# Patient Record
Sex: Female | Born: 1940 | Race: White | Hispanic: No | Marital: Married | Smoking: Former smoker
Health system: Southern US, Community
[De-identification: ages and names within clinical notes are randomized; demographics above are authoritative.]

## PROBLEM LIST (undated history)

## (undated) DIAGNOSIS — F419 Anxiety disorder, unspecified: Secondary | ICD-10-CM

## (undated) DIAGNOSIS — M1990 Unspecified osteoarthritis, unspecified site: Secondary | ICD-10-CM

## (undated) DIAGNOSIS — K219 Gastro-esophageal reflux disease without esophagitis: Secondary | ICD-10-CM

## (undated) DIAGNOSIS — I499 Cardiac arrhythmia, unspecified: Secondary | ICD-10-CM

## (undated) HISTORY — PX: BACK SURGERY: SHX140

## (undated) HISTORY — PX: CARDIAC CATHETERIZATION: SHX172

## (undated) HISTORY — PX: TONSILLECTOMY: SUR1361

## (undated) HISTORY — PX: WRIST FRACTURE SURGERY: SHX121

## (undated) HISTORY — PX: WRIST HARDWARE REMOVAL: SHX2673

## (undated) HISTORY — PX: VAGINAL HYSTERECTOMY: SUR661

## (undated) HISTORY — PX: CERVICAL DISC SURGERY: SHX588

## (undated) HISTORY — PX: EYE SURGERY: SHX253

## (undated) HISTORY — PX: COLONOSCOPY: SHX174

## (undated) HISTORY — PX: FRACTURE SURGERY: SHX138

---

## 1971-12-13 HISTORY — PX: TUBAL LIGATION: SHX77

## 1971-12-13 HISTORY — PX: APPENDECTOMY: SHX54

## 1998-11-26 ENCOUNTER — Other Ambulatory Visit: Admission: RE | Admit: 1998-11-26 | Discharge: 1998-11-26 | Payer: Self-pay | Admitting: Obstetrics and Gynecology

## 2000-10-25 ENCOUNTER — Other Ambulatory Visit: Admission: RE | Admit: 2000-10-25 | Discharge: 2000-10-25 | Payer: Self-pay | Admitting: Internal Medicine

## 2003-07-23 ENCOUNTER — Ambulatory Visit (HOSPITAL_COMMUNITY): Admission: RE | Admit: 2003-07-23 | Discharge: 2003-07-23 | Payer: Self-pay | Admitting: Gastroenterology

## 2003-07-23 ENCOUNTER — Encounter (INDEPENDENT_AMBULATORY_CARE_PROVIDER_SITE_OTHER): Payer: Self-pay | Admitting: *Deleted

## 2003-07-23 ENCOUNTER — Encounter: Payer: Self-pay | Admitting: Gastroenterology

## 2007-10-05 ENCOUNTER — Ambulatory Visit: Payer: Self-pay | Admitting: Unknown Physician Specialty

## 2007-12-27 ENCOUNTER — Ambulatory Visit: Payer: Self-pay | Admitting: Unknown Physician Specialty

## 2009-11-03 ENCOUNTER — Encounter (INDEPENDENT_AMBULATORY_CARE_PROVIDER_SITE_OTHER): Payer: Self-pay

## 2009-11-04 ENCOUNTER — Ambulatory Visit: Payer: Self-pay | Admitting: Internal Medicine

## 2009-11-19 ENCOUNTER — Ambulatory Visit: Payer: Self-pay | Admitting: Internal Medicine

## 2009-11-23 ENCOUNTER — Encounter: Payer: Self-pay | Admitting: Internal Medicine

## 2011-04-29 NOTE — Op Note (Signed)
Erika Black, Erika Black                         ACCOUNT NO.:  [id number]   MEDICAL RECORD NO.:  [id number]                   PATIENT TYPE:  AMB   LOCATION:  ENDO                                 FACILITY:  [facility]   PHYSICIAN:  Petra Kuba, M.D.                 DATE OF BIRTH:  25-Jan-1941   DATE OF PROCEDURE:  07/23/2003  DATE OF DISCHARGE:                                 OPERATIVE REPORT   PROCEDURE:  Colonoscopy.   INDICATIONS FOR PROCEDURE:  Screening.   Consent was signed after risks, benefits, and options were thoroughly  discussed in the office.   MEDICATIONS USED:  Demerol 75, Versed 10.   PROCEDURE:  Rectal inspection was pertinent for external hemorrhoids.  Digital exam was negative.  The first video pediatric adjustable colonoscope  was inserted and unfortunately, we had some looping and tortuosity at the  splenic flexure, we could advance to the proximal hepatic flexure but  despite multiple abdominal pressure, rolling her on her back, and on her  right side, we could not advance any further and elected to withdrawal.  No  abnormalities were seen on the withdrawal.  We went ahead and retroflexed  which revealed some internal hemorrhoids.  We then removed this scope and  inserted the regular colonoscope and with some abdominal pressure, advanced  to the proximal level of the hepatic flexure, however, rolling her on her  back was not helpful and unfortunately, we fell back to the splenic flexure  probably across the hepatic flexure and could not advance any further due to  looping, so we elected to stop the procedure at that juncture.  No  abnormality was seen on slow withdrawal.  There was some trauma to some  hemorrhoids.  The scope was removed.  The patient tolerated the procedure  fair.  There was no obvious complications.   ENDOSCOPIC DIAGNOSIS:  1. Internal and external hemorrhoids with some trauma from the scope.  2. Long looping colon.  3. Unable to advance  either the pediatric adjustable or the regular scope     past the hepatic flexure.  4. No obvious abnormalities seen.    PLAN:  Get an air contrast barium enema today and if OK, recheck colon  screening in five years, otherwise return to the care of Dr. Fredia Beets for  the customary health care maintenance to include yearly rectals and guaiacs  and also would refer back to Dr. Patty Sermons for some bigeminy and abnormal  heart rates which he has had in the past, although they were asymptomatic  during our test.                                               Petra Kuba, M.D.    MEM/MEDQ  D:  07/23/2003  T:  07/23/2003  Job:  [id number]   cc:   Cassell Clement, M.D.  [address]  [place]  [place] [address]  Fax: [phone]   Geoffry Paradise, M.D.

## 2014-04-30 ENCOUNTER — Encounter (INDEPENDENT_AMBULATORY_CARE_PROVIDER_SITE_OTHER): Payer: Medicare Other | Admitting: Ophthalmology

## 2014-04-30 DIAGNOSIS — H33001 Unspecified retinal detachment with retinal break, right eye: Secondary | ICD-10-CM | POA: Diagnosis present

## 2014-04-30 DIAGNOSIS — H3530 Unspecified macular degeneration: Secondary | ICD-10-CM

## 2014-04-30 DIAGNOSIS — H2510 Age-related nuclear cataract, unspecified eye: Secondary | ICD-10-CM

## 2014-04-30 DIAGNOSIS — H33009 Unspecified retinal detachment with retinal break, unspecified eye: Secondary | ICD-10-CM

## 2014-04-30 DIAGNOSIS — H43819 Vitreous degeneration, unspecified eye: Secondary | ICD-10-CM

## 2014-04-30 NOTE — H&P (Signed)
Erika Black is an [age] y.o. female.   Chief Complaint:Severe loss of vision right eye one week ago HPI: Noted loss of vision from the nose to the central vision right eye one week ago  No past medical history on file.  No past surgical history on file.  No family history on file. Social History:  has no tobacco, alcohol, and drug history on file.  Allergies: Allergies not on file  No prescriptions prior to admission    Review of systems otherwise negative  There were no vitals taken for this visit.  Physical exam: Mental status: oriented x3. Eyes: See eye exam associated with this date of surgery in media tab.  Scanned in by scanning center Ears, Nose, Throat: within normal limits Neck: Within Normal limits General: within normal limits Chest: Within normal limits Breast: deferred Heart: Within normal limits Abdomen: Within normal limits GU: deferred Extremities: within normal limits Skin: within normal limits  Assessment/Plan Rhegmatogenous Retinal Detachment  Right eye Plan: To [facility] Scleral buckle, laser treatment, gas injection, possible pars plana vitrectomy right eye  Sherrie GeorgeJohn D [name] 04/30/2014, 5:28 PM

## 2014-05-01 ENCOUNTER — Encounter (HOSPITAL_COMMUNITY): Payer: Self-pay | Admitting: Pharmacy Technician

## 2014-05-02 NOTE — Progress Notes (Signed)
I was unable to reach patient by phone.  I left  A message on voice mail.  I instructed the patient to arrive at [facility] Main entrance at the time she was instructed by Dr Ashley Royalty.  Nothing to eat or drink after midnight.   I instructed the patient to take the following medications in the am with just enough water to get them down that Dr Ashley Royalty instructed her.to take  I asked patient to not wear any lotions, powders, cologne, jewelry, piercing, make-up or nail polish.  I asked the patient to call [phone], in the am if there were any questions or problems.

## 2014-05-05 MED ORDER — TROPICAMIDE 1 % OP SOLN
1.0000 [drp] | OPHTHALMIC | Status: DC | PRN
  Administered 2014-05-06: 1 [drp] via OPHTHALMIC

## 2014-05-05 MED ORDER — GATIFLOXACIN 0.5 % OP SOLN: 1.0000 [drp] | OPHTHALMIC | Status: DC | PRN

## 2014-05-05 MED ORDER — PHENYLEPHRINE HCL 2.5 % OP SOLN
1.0000 [drp] | OPHTHALMIC | Status: DC | PRN
  Administered 2014-05-06: 1 [drp] via OPHTHALMIC

## 2014-05-05 MED ORDER — CYCLOPENTOLATE HCL 1 % OP SOLN: 1.0000 [drp] | OPHTHALMIC | Status: DC | PRN

## 2014-05-06 ENCOUNTER — Ambulatory Visit (HOSPITAL_COMMUNITY): Payer: Medicare Other | Admitting: Anesthesiology

## 2014-05-06 ENCOUNTER — Ambulatory Visit (HOSPITAL_COMMUNITY): Payer: Medicare Other

## 2014-05-06 ENCOUNTER — Encounter (HOSPITAL_COMMUNITY): Payer: Self-pay | Admitting: *Deleted

## 2014-05-06 ENCOUNTER — Encounter (HOSPITAL_COMMUNITY)
Admission: RE | Disposition: A | Discharge status: Home / Self Care | Payer: Self-pay | Source: Ambulatory Visit | Attending: Ophthalmology

## 2014-05-06 ENCOUNTER — Encounter (HOSPITAL_COMMUNITY): Payer: Medicare Other | Admitting: Anesthesiology

## 2014-05-06 ENCOUNTER — Ambulatory Visit (HOSPITAL_COMMUNITY)
Admission: RE | Admit: 2014-05-06 | Discharge: 2014-05-07 | Disposition: A | Discharge status: Home / Self Care | Payer: Medicare Other | Source: Ambulatory Visit | Attending: Ophthalmology | Admitting: Ophthalmology

## 2014-05-06 DIAGNOSIS — K219 Gastro-esophageal reflux disease without esophagitis: Secondary | ICD-10-CM | POA: Insufficient documentation

## 2014-05-06 DIAGNOSIS — H33001 Unspecified retinal detachment with retinal break, right eye: Secondary | ICD-10-CM

## 2014-05-06 DIAGNOSIS — H33009 Unspecified retinal detachment with retinal break, unspecified eye: Secondary | ICD-10-CM | POA: Insufficient documentation

## 2014-05-06 DIAGNOSIS — Z87891 Personal history of nicotine dependence: Secondary | ICD-10-CM | POA: Insufficient documentation

## 2014-05-06 DIAGNOSIS — I499 Cardiac arrhythmia, unspecified: Secondary | ICD-10-CM | POA: Insufficient documentation

## 2014-05-06 HISTORY — DX: Cardiac arrhythmia, unspecified: I49.9

## 2014-05-06 HISTORY — DX: Anxiety disorder, unspecified: F41.9

## 2014-05-06 HISTORY — DX: Unspecified osteoarthritis, unspecified site: M19.90

## 2014-05-06 HISTORY — PX: PHOTOCOAGULATION WITH LASER: SHX6027

## 2014-05-06 HISTORY — PX: GAS INSERTION: SHX5336

## 2014-05-06 HISTORY — PX: SCLERAL BUCKLE: SHX5340

## 2014-05-06 HISTORY — DX: Gastro-esophageal reflux disease without esophagitis: K21.9

## 2014-05-06 SURGERY — SCLERAL BUCKLE
Anesthesia: General | Site: Eye | Laterality: Right

## 2014-05-06 MED ORDER — BACITRACIN-POLYMYXIN B 500-10000 UNIT/GM OP OINT
TOPICAL_OINTMENT | OPHTHALMIC | Status: DC | PRN
  Administered 2014-05-06: 1 via OPHTHALMIC

## 2014-05-06 MED ORDER — LIDOCAINE HCL (CARDIAC) 20 MG/ML IV SOLN
INTRAVENOUS | Status: AC
  Filled 2014-05-06: qty 10

## 2014-05-06 MED ORDER — ACETAZOLAMIDE SODIUM 500 MG IJ SOLR
500.0000 mg | Freq: Once | INTRAMUSCULAR | Status: AC
  Administered 2014-05-07: 500 mg via INTRAVENOUS
  Filled 2014-05-06: qty 500

## 2014-05-06 MED ORDER — SODIUM CHLORIDE 0.45 % IV SOLN
INTRAVENOUS | Status: DC
  Administered 2014-05-06: 10 mL/h via INTRAVENOUS

## 2014-05-06 MED ORDER — DEXAMETHASONE SODIUM PHOSPHATE 10 MG/ML IJ SOLN
INTRAMUSCULAR | Status: AC
  Filled 2014-05-06: qty 1

## 2014-05-06 MED ORDER — MAGNESIUM HYDROXIDE 400 MG/5ML PO SUSP: 15.0000 mL | Freq: Four times a day (QID) | ORAL | Status: DC | PRN

## 2014-05-06 MED ORDER — CYCLOPENTOLATE HCL 1 % OP SOLN
1.0000 [drp] | OPHTHALMIC | Status: AC | PRN
  Administered 2014-05-06 (×3): 1 [drp] via OPHTHALMIC

## 2014-05-06 MED ORDER — GATIFLOXACIN 0.5 % OP SOLN
OPHTHALMIC | Status: AC
  Filled 2014-05-06: qty 2.5

## 2014-05-06 MED ORDER — POLYMYXIN B SULFATE 500000 UNITS IJ SOLR
INTRAMUSCULAR | Status: DC | PRN
  Administered 2014-05-06: 12:00:00

## 2014-05-06 MED ORDER — BACITRACIN-POLYMYXIN B 500-10000 UNIT/GM OP OINT
1.0000 "application " | TOPICAL_OINTMENT | Freq: Four times a day (QID) | OPHTHALMIC | Status: DC
  Filled 2014-05-06: qty 3.5

## 2014-05-06 MED ORDER — SODIUM CHLORIDE 0.9 % IJ SOLN
INTRAMUSCULAR | Status: AC
  Filled 2014-05-06: qty 10

## 2014-05-06 MED ORDER — BSS IO SOLN
INTRAOCULAR | Status: AC
  Filled 2014-05-06: qty 15

## 2014-05-06 MED ORDER — TROPICAMIDE 1 % OP SOLN
OPHTHALMIC | Status: AC
  Administered 2014-05-06: 1 [drp] via OPHTHALMIC
  Filled 2014-05-06: qty 3

## 2014-05-06 MED ORDER — CEFAZOLIN SODIUM-DEXTROSE 2-3 GM-% IV SOLR
2.0000 g | INTRAVENOUS | Status: AC
  Administered 2014-05-06: 2 g via INTRAVENOUS

## 2014-05-06 MED ORDER — HYDROMORPHONE HCL PF 1 MG/ML IJ SOLN
INTRAMUSCULAR | Status: AC
  Administered 2014-05-06: 0.5 mg via INTRAVENOUS
  Filled 2014-05-06: qty 1

## 2014-05-06 MED ORDER — GABAPENTIN 300 MG PO CAPS
300.0000 mg | ORAL_CAPSULE | Freq: Two times a day (BID) | ORAL | Status: DC
  Administered 2014-05-06: 300 mg via ORAL
  Filled 2014-05-06 (×3): qty 1

## 2014-05-06 MED ORDER — ATROPINE SULFATE 1 % OP SOLN
OPHTHALMIC | Status: AC
  Filled 2014-05-06: qty 2

## 2014-05-06 MED ORDER — BUPIVACAINE HCL (PF) 0.75 % IJ SOLN
INTRAMUSCULAR | Status: DC | PRN
  Administered 2014-05-06: 10 mL

## 2014-05-06 MED ORDER — LATANOPROST 0.005 % OP SOLN
1.0000 [drp] | Freq: Every day | OPHTHALMIC | Status: DC
  Filled 2014-05-06: qty 2.5

## 2014-05-06 MED ORDER — TROPICAMIDE 1 % OP SOLN
1.0000 [drp] | OPHTHALMIC | Status: DC | PRN
  Administered 2014-05-06 (×2): 1 [drp] via OPHTHALMIC

## 2014-05-06 MED ORDER — HYPROMELLOSE (GONIOSCOPIC) 2.5 % OP SOLN
OPHTHALMIC | Status: AC
  Filled 2014-05-06: qty 15

## 2014-05-06 MED ORDER — BUPIVACAINE HCL (PF) 0.75 % IJ SOLN
INTRAMUSCULAR | Status: AC
  Filled 2014-05-06: qty 10

## 2014-05-06 MED ORDER — GATIFLOXACIN 0.5 % OP SOLN
1.0000 [drp] | Freq: Four times a day (QID) | OPHTHALMIC | Status: DC
  Filled 2014-05-06: qty 2.5

## 2014-05-06 MED ORDER — PREDNISOLONE ACETATE 1 % OP SUSP
1.0000 [drp] | Freq: Four times a day (QID) | OPHTHALMIC | Status: DC
  Filled 2014-05-06: qty 5
  Filled 2014-05-06: qty 1

## 2014-05-06 MED ORDER — DOCUSATE SODIUM 100 MG PO CAPS
100.0000 mg | ORAL_CAPSULE | Freq: Two times a day (BID) | ORAL | Status: DC
  Administered 2014-05-06: 100 mg via ORAL
  Filled 2014-05-06: qty 1

## 2014-05-06 MED ORDER — GLYCOPYRROLATE 0.2 MG/ML IJ SOLN
INTRAMUSCULAR | Status: AC
  Filled 2014-05-06: qty 3

## 2014-05-06 MED ORDER — ONDANSETRON HCL 4 MG/2ML IJ SOLN
INTRAMUSCULAR | Status: AC
  Filled 2014-05-06: qty 2

## 2014-05-06 MED ORDER — PHENYLEPHRINE HCL 2.5 % OP SOLN
1.0000 [drp] | OPHTHALMIC | Status: DC | PRN
  Administered 2014-05-06 (×2): 1 [drp] via OPHTHALMIC

## 2014-05-06 MED ORDER — TETRACAINE HCL 0.5 % OP SOLN
2.0000 [drp] | Freq: Once | OPHTHALMIC | Status: DC
  Filled 2014-05-06: qty 2

## 2014-05-06 MED ORDER — ONDANSETRON HCL 4 MG/2ML IJ SOLN: 4.0000 mg | Freq: Once | INTRAMUSCULAR | Status: DC | PRN

## 2014-05-06 MED ORDER — TEMAZEPAM 15 MG PO CAPS: 15.0000 mg | ORAL_CAPSULE | Freq: Every evening | ORAL | Status: DC | PRN

## 2014-05-06 MED ORDER — SODIUM CHLORIDE 0.9 % IV SOLN
INTRAVENOUS | Status: DC
  Administered 2014-05-06 (×2): via INTRAVENOUS

## 2014-05-06 MED ORDER — ACETAZOLAMIDE SODIUM 500 MG IJ SOLR
INTRAMUSCULAR | Status: AC
  Filled 2014-05-06: qty 500

## 2014-05-06 MED ORDER — METOPROLOL SUCCINATE ER 25 MG PO TB24
25.0000 mg | ORAL_TABLET | Freq: Every day | ORAL | Status: DC
  Filled 2014-05-06: qty 1

## 2014-05-06 MED ORDER — GLYCOPYRROLATE 0.2 MG/ML IJ SOLN
INTRAMUSCULAR | Status: DC | PRN
  Administered 2014-05-06: 0.4 mg via INTRAVENOUS

## 2014-05-06 MED ORDER — FENTANYL CITRATE 0.05 MG/ML IJ SOLN
INTRAMUSCULAR | Status: DC | PRN
  Administered 2014-05-06: 100 ug via INTRAVENOUS
  Administered 2014-05-06 (×3): 50 ug via INTRAVENOUS

## 2014-05-06 MED ORDER — EPHEDRINE SULFATE 50 MG/ML IJ SOLN
INTRAMUSCULAR | Status: DC | PRN
Start: 2014-05-06 — End: 2014-05-06
  Administered 2014-05-06 (×2): 10 mg via INTRAVENOUS

## 2014-05-06 MED ORDER — LACTATED RINGERS IV SOLN: INTRAVENOUS | Status: DC | PRN

## 2014-05-06 MED ORDER — PHENYLEPHRINE HCL 2.5 % OP SOLN
OPHTHALMIC | Status: AC
  Administered 2014-05-06: 1 [drp] via OPHTHALMIC
  Filled 2014-05-06: qty 15

## 2014-05-06 MED ORDER — ONDANSETRON HCL 4 MG/2ML IJ SOLN
INTRAMUSCULAR | Status: DC | PRN
  Administered 2014-05-06: 4 mg via INTRAVENOUS

## 2014-05-06 MED ORDER — PROPOFOL 10 MG/ML IV BOLUS
INTRAVENOUS | Status: DC | PRN
  Administered 2014-05-06: 30 mg via INTRAVENOUS
  Administered 2014-05-06: 150 mg via INTRAVENOUS

## 2014-05-06 MED ORDER — CYCLOPENTOLATE HCL 1 % OP SOLN
OPHTHALMIC | Status: AC
  Filled 2014-05-06: qty 2

## 2014-05-06 MED ORDER — HYDROMORPHONE HCL PF 1 MG/ML IJ SOLN
0.2500 mg | INTRAMUSCULAR | Status: DC | PRN
  Administered 2014-05-06 (×2): 0.5 mg via INTRAVENOUS

## 2014-05-06 MED ORDER — FENTANYL CITRATE 0.05 MG/ML IJ SOLN
INTRAMUSCULAR | Status: AC
  Filled 2014-05-06: qty 5

## 2014-05-06 MED ORDER — GENTAMICIN SULFATE 40 MG/ML IJ SOLN
INTRAMUSCULAR | Status: AC
  Filled 2014-05-06: qty 2

## 2014-05-06 MED ORDER — ATROPINE SULFATE 1 % OP SOLN
OPHTHALMIC | Status: DC | PRN
  Administered 2014-05-06: 1 [drp] via OPHTHALMIC

## 2014-05-06 MED ORDER — SODIUM HYALURONATE 10 MG/ML IO SOLN
INTRAOCULAR | Status: AC
  Filled 2014-05-06: qty 0.85

## 2014-05-06 MED ORDER — HEMOSTATIC AGENTS (NO CHARGE) OPTIME
TOPICAL | Status: DC | PRN
  Administered 2014-05-06: 1

## 2014-05-06 MED ORDER — TRAMADOL HCL 50 MG PO TABS
50.0000 mg | ORAL_TABLET | Freq: Two times a day (BID) | ORAL | Status: DC
  Administered 2014-05-06: 50 mg via ORAL
  Filled 2014-05-06: qty 1

## 2014-05-06 MED ORDER — DEXAMETHASONE SODIUM PHOSPHATE 10 MG/ML IJ SOLN
INTRAMUSCULAR | Status: DC | PRN
  Administered 2014-05-06: 10 mg

## 2014-05-06 MED ORDER — ACETAMINOPHEN 325 MG PO TABS: 325.0000 mg | ORAL_TABLET | ORAL | Status: DC | PRN

## 2014-05-06 MED ORDER — LIDOCAINE HCL (CARDIAC) 20 MG/ML IV SOLN
INTRAVENOUS | Status: DC | PRN
  Administered 2014-05-06: 100 mg via INTRAVENOUS

## 2014-05-06 MED ORDER — BSS IO SOLN
INTRAOCULAR | Status: DC | PRN
  Administered 2014-05-06: 15 mL

## 2014-05-06 MED ORDER — HYDROCODONE-ACETAMINOPHEN 5-325 MG PO TABS: 1.0000 | ORAL_TABLET | ORAL | Status: DC | PRN

## 2014-05-06 MED ORDER — ROCURONIUM BROMIDE 50 MG/5ML IV SOLN
INTRAVENOUS | Status: AC
  Filled 2014-05-06: qty 1

## 2014-05-06 MED ORDER — ROCURONIUM BROMIDE 100 MG/10ML IV SOLN
INTRAVENOUS | Status: DC | PRN
  Administered 2014-05-06: 30 mg via INTRAVENOUS
  Administered 2014-05-06: 10 mg via INTRAVENOUS

## 2014-05-06 MED ORDER — BACITRACIN-POLYMYXIN B 500-10000 UNIT/GM OP OINT
TOPICAL_OINTMENT | OPHTHALMIC | Status: AC
  Filled 2014-05-06: qty 3.5

## 2014-05-06 MED ORDER — POLYMYXIN B SULFATE 500000 UNITS IJ SOLR
INTRAMUSCULAR | Status: AC
  Filled 2014-05-06: qty 1

## 2014-05-06 MED ORDER — BSS PLUS IO SOLN
INTRAOCULAR | Status: AC
  Filled 2014-05-06: qty 500

## 2014-05-06 MED ORDER — BRIMONIDINE TARTRATE 0.2 % OP SOLN
1.0000 [drp] | Freq: Two times a day (BID) | OPHTHALMIC | Status: DC
  Filled 2014-05-06: qty 5

## 2014-05-06 MED ORDER — LIDOCAINE HCL 4 % MT SOLN
OROMUCOSAL | Status: DC | PRN
  Administered 2014-05-06: 4 mL via TOPICAL

## 2014-05-06 MED ORDER — EPINEPHRINE HCL 1 MG/ML IJ SOLN
INTRAMUSCULAR | Status: AC
  Filled 2014-05-06: qty 1

## 2014-05-06 MED ORDER — DEXTROSE 5 % IV SOLN: 3.0000 g | INTRAVENOUS | Status: DC

## 2014-05-06 MED ORDER — TRIAMCINOLONE ACETONIDE 40 MG/ML IJ SUSP
INTRAMUSCULAR | Status: AC
  Filled 2014-05-06: qty 5

## 2014-05-06 MED ORDER — MORPHINE SULFATE 2 MG/ML IJ SOLN
1.0000 mg | INTRAMUSCULAR | Status: DC | PRN
  Administered 2014-05-06: 2 mg via INTRAVENOUS
  Filled 2014-05-06: qty 1

## 2014-05-06 MED ORDER — NEOSTIGMINE METHYLSULFATE 10 MG/10ML IV SOLN
INTRAVENOUS | Status: AC
  Filled 2014-05-06: qty 1

## 2014-05-06 MED ORDER — ONDANSETRON HCL 4 MG/2ML IJ SOLN: 4.0000 mg | Freq: Four times a day (QID) | INTRAMUSCULAR | Status: DC | PRN

## 2014-05-06 MED ORDER — NEOSTIGMINE METHYLSULFATE 10 MG/10ML IV SOLN
INTRAVENOUS | Status: DC | PRN
  Administered 2014-05-06: 3 mg via INTRAVENOUS

## 2014-05-06 MED ORDER — SUCCINYLCHOLINE CHLORIDE 20 MG/ML IJ SOLN
INTRAMUSCULAR | Status: AC
  Filled 2014-05-06: qty 1

## 2014-05-06 MED ORDER — GATIFLOXACIN 0.5 % OP SOLN
1.0000 [drp] | OPHTHALMIC | Status: AC | PRN
  Administered 2014-05-06 (×3): 1 [drp] via OPHTHALMIC

## 2014-05-06 MED ORDER — LIDOCAINE HCL 2 % IJ SOLN
INTRAMUSCULAR | Status: AC
  Filled 2014-05-06: qty 20

## 2014-05-06 MED ORDER — PROPOFOL 10 MG/ML IV BOLUS
INTRAVENOUS | Status: AC
  Filled 2014-05-06: qty 20

## 2014-05-06 SURGICAL SUPPLY — 77 items
APL SRG 3 HI ABS STRL LF PLS (MISCELLANEOUS) ×8
APPLICATOR DR MATTHEWS STRL (MISCELLANEOUS) ×22 IMPLANT
BALL CTTN LRG ABS STRL LF (GAUZE/BANDAGES/DRESSINGS) ×3
BAND CIRCLING RETINAL 2.5 (Ophthalmic Related) IMPLANT
BAND RTNL 125X2.5X.6 CRC (Ophthalmic Related) ×1 IMPLANT
BLADE EYE CATARACT 19 1.4 BEAV (BLADE) ×2 IMPLANT
BLADE MVR KNIFE 19G (BLADE) IMPLANT
CANNULA ANT CHAM MAIN (OPHTHALMIC RELATED) IMPLANT
CANNULA DUAL BORE 23G (CANNULA) IMPLANT
CORDS BIPOLAR (ELECTRODE) IMPLANT
COTTONBALL LRG STERILE PKG (GAUZE/BANDAGES/DRESSINGS) ×9 IMPLANT
COVER SURGICAL LIGHT HANDLE (MISCELLANEOUS) ×3 IMPLANT
DRAPE INCISE 51X51 W/FILM STRL (DRAPES) ×3 IMPLANT
DRAPE OPHTHALMIC 77X100 STRL (CUSTOM PROCEDURE TRAY) ×3 IMPLANT
ERASER HMR WETFIELD 23G BP (MISCELLANEOUS) IMPLANT
FILTER BLUE MILLIPORE (MISCELLANEOUS) ×4 IMPLANT
FILTER STRAW FLUID ASPIR (MISCELLANEOUS) IMPLANT
GAS AUTO FILL CONSTEL (OPHTHALMIC)
GAS AUTO FILL CONSTELLATION (OPHTHALMIC) ×1 IMPLANT
GAS OPHTHALMIC (MISCELLANEOUS) ×2 IMPLANT
GLOVE SS BIOGEL STRL SZ 6.5 (GLOVE) ×1 IMPLANT
GLOVE SS BIOGEL STRL SZ 7 (GLOVE) ×1 IMPLANT
GLOVE SUPERSENSE BIOGEL SZ 6.5 (GLOVE) ×2
GLOVE SUPERSENSE BIOGEL SZ 7 (GLOVE) ×2
GLOVE SURG 8.5 LATEX PF (GLOVE) ×3 IMPLANT
GLOVE SURG SS PI 6.0 STRL IVOR (GLOVE) ×2 IMPLANT
GLOVE SURG SS PI 6.5 STRL IVOR (GLOVE) ×4 IMPLANT
GOWN STRL REUS W/ TWL LRG LVL3 (GOWN DISPOSABLE) ×2 IMPLANT
GOWN STRL REUS W/TWL LRG LVL3 (GOWN DISPOSABLE) ×15
IMPL SILICONE (Ophthalmic Related) ×1 IMPLANT
IMPLANT SILICONE (Ophthalmic Related) ×6 IMPLANT
KIT BASIN OR (CUSTOM PROCEDURE TRAY) ×3 IMPLANT
KIT PERFLUORON PROCEDURE 5ML (MISCELLANEOUS) IMPLANT
KIT ROOM TURNOVER OR (KITS) ×3 IMPLANT
KNIFE CRESCENT 1.75 EDGEAHEAD (BLADE) IMPLANT
KNIFE GRIESHABER SHARP 2.5MM (MISCELLANEOUS) ×11 IMPLANT
MASK EYE SHIELD (GAUZE/BANDAGES/DRESSINGS) ×2 IMPLANT
NDL 18GX1X1/2 (RX/OR ONLY) (NEEDLE) ×1 IMPLANT
NDL 25GX 5/8IN NON SAFETY (NEEDLE) IMPLANT
NDL HYPO 30X.5 LL (NEEDLE) ×2 IMPLANT
NEEDLE 18GX1X1/2 (RX/OR ONLY) (NEEDLE) ×3 IMPLANT
NEEDLE 25GX 5/8IN NON SAFETY (NEEDLE) IMPLANT
NEEDLE 27GAX1X1/2 (NEEDLE) IMPLANT
NEEDLE HYPO 30X.5 LL (NEEDLE) ×9 IMPLANT
NS IRRIG 1000ML POUR BTL (IV SOLUTION) ×3 IMPLANT
PACK VITRECTOMY CUSTOM (CUSTOM PROCEDURE TRAY) ×3 IMPLANT
PAD ARMBOARD 7.5X6 YLW CONV (MISCELLANEOUS) ×6 IMPLANT
PAK PIK VITRECTOMY CVS 25GA (OPHTHALMIC) IMPLANT
PROBE LASER ILLUM FLEX CVD 25G (OPHTHALMIC) IMPLANT
REPL STRA BRUSH NDL (NEEDLE) IMPLANT
REPL STRA BRUSH NEEDLE (NEEDLE) IMPLANT
RESERVOIR BACK FLUSH (MISCELLANEOUS) IMPLANT
ROLLS DENTAL (MISCELLANEOUS) ×6 IMPLANT
SLEEVE SCLERAL TYPE 270 (Ophthalmic Related) ×2 IMPLANT
SPEAR EYE SURG WECK-CEL (MISCELLANEOUS) ×16 IMPLANT
SPONGE GROOVED SILICONE 4X12X8 (Ophthalmic Related) ×2 IMPLANT
SPONGE SURGIFOAM ABS GEL 12-7 (HEMOSTASIS) ×2 IMPLANT
STOPCOCK 4 WAY LG BORE MALE ST (IV SETS) IMPLANT
SUT CHROMIC 7 0 TG140 8 (SUTURE) ×3 IMPLANT
SUT ETHILON 9 0 TG140 8 (SUTURE) IMPLANT
SUT MERSILENE 4 0 RV 2 (SUTURE) ×8 IMPLANT
SUT SILK 2 0 (SUTURE) ×3
SUT SILK 2-0 18XBRD TIE 12 (SUTURE) ×1 IMPLANT
SUT SILK 4 0 RB 1 (SUTURE) ×3 IMPLANT
SYR 20CC LL (SYRINGE) ×3 IMPLANT
SYR 50ML LL SCALE MARK (SYRINGE) IMPLANT
SYR 5ML LL (SYRINGE) ×1 IMPLANT
SYR BULB 3OZ (MISCELLANEOUS) ×3 IMPLANT
SYR TB 1ML LUER SLIP (SYRINGE) IMPLANT
SYRINGE 10CC LL (SYRINGE) ×1 IMPLANT
TAPE SURG TRANSPORE 1 IN (GAUZE/BANDAGES/DRESSINGS) IMPLANT
TAPE SURGICAL TRANSPORE 1 IN (GAUZE/BANDAGES/DRESSINGS) ×2
TIRE RTNL 2.5XGRV CNCV 9X (Ophthalmic Related) IMPLANT
TOWEL OR 17X24 6PK STRL BLUE (TOWEL DISPOSABLE) ×7 IMPLANT
TUBING ART PRESS 12 MALE/MALE (MISCELLANEOUS) IMPLANT
WATER STERILE IRR 1000ML POUR (IV SOLUTION) ×3 IMPLANT
WIPE INSTRUMENT VISIWIPE 73X73 (MISCELLANEOUS) ×3 IMPLANT

## 2014-05-06 NOTE — Progress Notes (Signed)
Pt reports EKG, CBC and BMP being done on Thursday 05/01/14 at [facility]. This nurse called [phone] to have results faxed. Results placed on chart.

## 2014-05-06 NOTE — Transfer of Care (Signed)
Immediate Anesthesia Transfer of Care Note  Patient: Erika Black  Procedure(s) Performed: Procedure(s) with comments: SCLERAL BUCKLE RIGHT EYE (Right) PHOTOCOAGULATION WITH LASER (Right) INSERTION OF GAS (Right) - C3F8  Patient Location: PACU  Anesthesia Type:General  Level of Consciousness: awake, oriented, patient cooperative and responds to stimulation  Airway & Oxygen Therapy: Patient Spontanous Breathing and Patient connected to nasal cannula oxygen  Post-op Assessment: Report given to PACU RN and Post -op Vital signs reviewed and stable  Post vital signs: Reviewed and stable  Complications: No apparent anesthesia complications

## 2014-05-06 NOTE — Anesthesia Preprocedure Evaluation (Signed)
Anesthesia Evaluation  Patient identified by MRN, date of birth, ID band Patient awake    Reviewed: Allergy & Precautions, H&P , NPO status , Patient's Chart, lab work & pertinent test results  Airway       Dental   Pulmonary former smoker,          Cardiovascular + dysrhythmias     Neuro/Psych    GI/Hepatic GERD-  ,  Endo/Other    Renal/GU      Musculoskeletal   Abdominal   Peds  Hematology   Anesthesia Other Findings   Reproductive/Obstetrics                           Anesthesia Physical Anesthesia Plan  ASA: II  Anesthesia Plan: General   Post-op Pain Management:    Induction: Intravenous  Airway Management Planned: Oral ETT  Additional Equipment:   Intra-op Plan:   Post-operative Plan: Extubation in OR  Informed Consent: I have reviewed the patients History and Physical, chart, labs and discussed the procedure including the risks, benefits and alternatives for the proposed anesthesia with the patient or authorized representative who has indicated his/her understanding and acceptance.     Plan Discussed with:   Anesthesia Plan Comments:         Anesthesia Quick Evaluation

## 2014-05-06 NOTE — H&P (Signed)
I examined the patient today and there is no change in the medical status 

## 2014-05-06 NOTE — Brief Op Note (Signed)
Brief Operative note   Preoperative diagnosis:  RETINAL DETACHMENT RIGHT EYE Postoperative diagnosis  *Rhegmatogenous retinal detachment right eye  Procedures: Scleral buckle, laser, gas injection right eye  Surgeon:  Sherrie George, MD...  Assistant:  Rosalie Doctor SA    Anesthesia: General  Specimen: none  Estimated blood loss:  1cc  Complications: none  Patient sent to PACU in good condition  Composed by Sherrie George MD  Dictation number: [phone]

## 2014-05-06 NOTE — OR Nursing (Addendum)
Information card regarding C3F8 gas bubble for right eye scleral buckle procedure attached to patient's chart. Green information armband alerting to use of gas bubble in right eye secured to patient's right wrist.  Oralia Manis, RN

## 2014-05-07 MED ORDER — BRIMONIDINE TARTRATE 0.2 % OP SOLN: 1.0000 [drp] | Freq: Two times a day (BID) | OPHTHALMIC | Status: DC

## 2014-05-07 MED ORDER — BACITRACIN-POLYMYXIN B 500-10000 UNIT/GM OP OINT: 1.0000 "application " | TOPICAL_OINTMENT | Freq: Four times a day (QID) | OPHTHALMIC | Status: DC

## 2014-05-07 MED ORDER — HYDROCODONE-ACETAMINOPHEN 5-325 MG PO TABS: 1.0000 | ORAL_TABLET | ORAL | Status: DC | PRN

## 2014-05-07 MED ORDER — LATANOPROST 0.005 % OP SOLN: 1.0000 [drp] | Freq: Every day | OPHTHALMIC | Status: DC

## 2014-05-07 MED ORDER — PREDNISOLONE ACETATE 1 % OP SUSP: 1.0000 [drp] | Freq: Four times a day (QID) | OPHTHALMIC | Status: DC

## 2014-05-07 MED ORDER — GATIFLOXACIN 0.5 % OP SOLN: 1.0000 [drp] | Freq: Four times a day (QID) | OPHTHALMIC | Status: DC

## 2014-05-07 NOTE — Anesthesia Postprocedure Evaluation (Signed)
  Anesthesia Post-op Note  Patient: Erika Black  Procedure(s) Performed: Procedure(s) with comments: SCLERAL BUCKLE RIGHT EYE (Right) PHOTOCOAGULATION WITH LASER (Right) INSERTION OF GAS (Right) - C3F8  Patient Location: PACU  Anesthesia Type:General  Level of Consciousness: awake, alert , oriented and patient cooperative  Airway and Oxygen Therapy: Patient Spontanous Breathing  Post-op Pain: none  Post-op Assessment: Post-op Vital signs reviewed, Patient's Cardiovascular Status Stable, Respiratory Function Stable, Patent Airway and No signs of Nausea or vomiting  Post-op Vital Signs: stable  Last Vitals:  Filed Vitals:   05/07/14 0535  BP: 128/51  Pulse: 70  Temp: 36.8 C  Resp: 16    Complications: No apparent anesthesia complications

## 2014-05-07 NOTE — Discharge Summary (Signed)
Discharge summary not needed on [facility] patients per medical records. 

## 2014-05-07 NOTE — Op Note (Signed)
[name], Black               ACCOUNT NO.:  [id number]  MEDICAL RECORD NO.:  [id number]  LOCATION:  6N27C                        FACILITY:  [facility]  PHYSICIAN:  [name]. Ashley Royalty, M.D. DATE OF BIRTH:  Mar 04, 1941  DATE OF PROCEDURE:  05/06/2014 DATE OF DISCHARGE:                              OPERATIVE REPORT   ADMISSION DIAGNOSIS:  Rhegmatogenous retinal detachment in the right eye.  PROCEDURE:  Scleral buckle, right eye; gas injection, right eye; retinal photocoagulation, right eye.  SURGEON:  [name]. Ashley Royalty, M.D.  ASSISTANT:  Rosalie Doctor, SA.  ANESTHESIA:  General.  DETAILS:  Usual prep and drape, 360 degree limbal peritomy, isolation of 4 rectus muscles on 2-0 silk.  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  Scleral dissection 360 degrees to admit a #279 intrascleral implant.  Diathermy placed in the bed.  Two sutures per quadrant and with additional 2 sutures in the upper temporal quadrant were used to attach the buckle and close the scleral flaps. Perforation site was chosen at 9 o'clock, a large amount of clear colorless subretinal fluid came forth.  The buckle elements were placed. A #279 element was placed with 1 mm trim from the posterior edge of the buckle.  The 240 band was placed around the eye with a 270 sleeve at 2 o'clock.  Indirect ophthalmoscopy showed some subretinal fluid remaining.  An additional perforation was then performed at 8 o'clock in the posterior aspect of the bed.  Diathermy and scleral incision was made.  Then, blunt perforation.  A moderate amount of clear colorless subretinal fluid came forth from this area.  Additional fluid was remaining and a 3rd perforation site was chosen at 11 o'clock.  A large amount of clear colorless subretinal fluid came forth from this perforation site.  C3F8 0.7 mL, 100% was injected into the vitreous cavity to reinflate the globe.  Minimal subretinal fluid was remaining at  that time.  The buckle was trimmed.  The band was trimmed and adjusted.  The scleral flaps were closed with interrupted sutures. Paracentesis x2 obtained with closing pressure of 10 with a Barraquer tonometer.  The conjunctiva was reapproximated with 7-0 chromic suture. Polymyxin and gentamicin were irrigated into tenon space.  Atropine solution was applied.  Marcaine was injected around the globe for postop pain.  Closing pressure was 10 with a Barraquer tonometer.  Polysporin ophthalmic ointment of a patch and shield were placed.  The patient awakened and taken to recovery in satisfactory condition.  Operative time 2 hours 30 minutes.     [name]. Ashley Royalty, M.D.    JDM/MEDQ  D:  05/06/2014  T:  05/07/2014  Job:  [id number]

## 2014-05-07 NOTE — Progress Notes (Signed)
Discharge instructions and prescription for vicodin given and explained to pt. And pt.'s son.  Both verbalize understanding of all orders and deny any questions.  IV removed by night nurse.  Pt. In stable condition for discharge.  Pt. Waiting to eat breakfast and then will discharge to home with son. Vanice Sarah

## 2014-05-07 NOTE — Progress Notes (Signed)
05/07/2014, 6:52 AM  Mental Status:  Awake, Alert, Oriented  Anterior segment: Cornea  Clear    Anterior Chamber Clear    Lens:    Cataract  Intra Ocular Pressure 28 mmHg with Tonopen  Vitreous: Clear 20%gas bubble   Retina:  Attached Good laser reaction   Impression: Excellent result Retina attached   Final Diagnosis: Principal Problem:   Rhegmatogenous retinal detachment of right eye   Plan: start post operative eye drops.  Add Xalatan and Alphagan.  Discharge to home.  Give post operative instructions  Sherrie George 05/07/2014, 6:52 AM

## 2014-05-07 NOTE — Progress Notes (Signed)
Discharge summary not needed on [facility] patients per medical records. 

## 2014-05-08 ENCOUNTER — Encounter (HOSPITAL_COMMUNITY): Payer: Self-pay | Admitting: Ophthalmology

## 2014-05-13 ENCOUNTER — Inpatient Hospital Stay (INDEPENDENT_AMBULATORY_CARE_PROVIDER_SITE_OTHER): Payer: Medicare Other | Admitting: Ophthalmology

## 2014-05-13 DIAGNOSIS — H33009 Unspecified retinal detachment with retinal break, unspecified eye: Secondary | ICD-10-CM

## 2014-06-03 ENCOUNTER — Encounter (INDEPENDENT_AMBULATORY_CARE_PROVIDER_SITE_OTHER): Payer: Medicare Other | Admitting: Ophthalmology

## 2014-06-03 DIAGNOSIS — H33009 Unspecified retinal detachment with retinal break, unspecified eye: Secondary | ICD-10-CM

## 2014-08-13 ENCOUNTER — Encounter (INDEPENDENT_AMBULATORY_CARE_PROVIDER_SITE_OTHER): Payer: Medicare Other | Admitting: Ophthalmology

## 2014-08-13 DIAGNOSIS — H2510 Age-related nuclear cataract, unspecified eye: Secondary | ICD-10-CM

## 2014-08-13 DIAGNOSIS — H3530 Unspecified macular degeneration: Secondary | ICD-10-CM

## 2014-08-13 DIAGNOSIS — H43819 Vitreous degeneration, unspecified eye: Secondary | ICD-10-CM

## 2014-08-13 DIAGNOSIS — I10 Essential (primary) hypertension: Secondary | ICD-10-CM

## 2014-08-13 DIAGNOSIS — H33009 Unspecified retinal detachment with retinal break, unspecified eye: Secondary | ICD-10-CM

## 2014-08-13 DIAGNOSIS — H35039 Hypertensive retinopathy, unspecified eye: Secondary | ICD-10-CM

## 2014-08-14 ENCOUNTER — Encounter (HOSPITAL_COMMUNITY): Payer: Self-pay | Admitting: Pharmacy Technician

## 2014-08-14 NOTE — H&P (Signed)
Erika Black is an 73 y.o. female.   Chief Complaint:loss of central vision and visual field right eye HPI: Had retinal detachment repair.  Now has sub retinal fluid inferiorly right eye  Past Medical History  Diagnosis Date  . Arthritis   . GERD (gastroesophageal reflux disease)     takes TUMS as needed.  . Anxiety   . Dysrhythmia     Pt reports taking metoprolol for irregular heart rate. Denies being diagnosed with HTN.    Past Surgical History  Procedure Laterality Date  . Fracture surgery Left     wrist  . Cervical disc surgery    . Abdominal hysterectomy    . Scleral buckle Right 05/06/2014    Procedure: SCLERAL BUCKLE RIGHT EYE;  Surgeon: Sherrie George, MD;  Location: [facility] OR;  Service: Ophthalmology;  Laterality: Right;  . Photocoagulation with laser Right 05/06/2014    Procedure: PHOTOCOAGULATION WITH LASER;  Surgeon: Sherrie George, MD;  Location: [facility] OR;  Service: Ophthalmology;  Laterality: Right;  . Gas insertion Right 05/06/2014    Procedure: INSERTION OF GAS;  Surgeon: Sherrie George, MD;  Location: [facility] OR;  Service: Ophthalmology;  Laterality: Right;  C3F8    No family history on file. Social History:  reports that she quit smoking about 5 years ago. She has never used smokeless tobacco. She reports that she drinks alcohol. She reports that she does not use illicit drugs.  Allergies:  Allergies  Allergen Reactions  . Sulfa Antibiotics     Rash on mouth    No prescriptions prior to admission    Review of systems otherwise negative  There were no vitals taken for this visit.  Physical exam: Mental status: oriented x3. Eyes: See eye exam associated with this date of surgery in media tab.  Scanned in by scanning center Ears, Nose, Throat: within normal limits Neck: Within Normal limits General: within normal limits Chest: Within normal limits Breast: deferred Heart: Within normal limits Abdomen: Within normal limits GU: deferred Extremities: within  normal limits Skin: within normal limits  Assessment/Plan Inferior retinal detachment right eye Plan: To [facility] for Pars plana vitrectomy, laser treatment, PFO injection and removal, gas injection right eye  Erika Black 08/14/2014, 7:41 AM

## 2014-08-15 ENCOUNTER — Encounter (HOSPITAL_COMMUNITY): Payer: Self-pay | Admitting: *Deleted

## 2014-08-18 MED ORDER — TROPICAMIDE 1 % OP SOLN
1.0000 [drp] | OPHTHALMIC | Status: DC | PRN
  Filled 2014-08-18: qty 3

## 2014-08-18 MED ORDER — PHENYLEPHRINE HCL 2.5 % OP SOLN
1.0000 [drp] | OPHTHALMIC | Status: DC | PRN
  Filled 2014-08-18: qty 2

## 2014-08-18 MED ORDER — GATIFLOXACIN 0.5 % OP SOLN
1.0000 [drp] | OPHTHALMIC | Status: DC | PRN
  Filled 2014-08-18: qty 2.5

## 2014-08-18 MED ORDER — CYCLOPENTOLATE HCL 1 % OP SOLN
1.0000 [drp] | OPHTHALMIC | Status: DC | PRN
  Filled 2014-08-18: qty 2

## 2014-08-18 MED ORDER — CEFAZOLIN SODIUM-DEXTROSE 2-3 GM-% IV SOLR
2.0000 g | INTRAVENOUS | Status: AC
  Administered 2014-08-19: 2 g via INTRAVENOUS
  Filled 2014-08-18: qty 50

## 2014-08-19 ENCOUNTER — Encounter (HOSPITAL_COMMUNITY)
Admission: RE | Disposition: A | Discharge status: Home / Self Care | Payer: Self-pay | Source: Ambulatory Visit | Attending: Ophthalmology

## 2014-08-19 ENCOUNTER — Encounter (HOSPITAL_COMMUNITY): Payer: Medicare Other | Admitting: Anesthesiology

## 2014-08-19 ENCOUNTER — Ambulatory Visit (HOSPITAL_COMMUNITY): Payer: Medicare Other | Admitting: Anesthesiology

## 2014-08-19 ENCOUNTER — Ambulatory Visit (HOSPITAL_COMMUNITY)
Admission: RE | Admit: 2014-08-19 | Discharge: 2014-08-20 | Disposition: A | Discharge status: Home / Self Care | Payer: Medicare Other | Source: Ambulatory Visit | Attending: Ophthalmology | Admitting: Ophthalmology

## 2014-08-19 ENCOUNTER — Encounter (HOSPITAL_COMMUNITY): Payer: Self-pay | Admitting: *Deleted

## 2014-08-19 DIAGNOSIS — H33009 Unspecified retinal detachment with retinal break, unspecified eye: Secondary | ICD-10-CM | POA: Diagnosis present

## 2014-08-19 DIAGNOSIS — Z882 Allergy status to sulfonamides status: Secondary | ICD-10-CM | POA: Diagnosis not present

## 2014-08-19 DIAGNOSIS — H33001 Unspecified retinal detachment with retinal break, right eye: Secondary | ICD-10-CM | POA: Diagnosis present

## 2014-08-19 DIAGNOSIS — F411 Generalized anxiety disorder: Secondary | ICD-10-CM | POA: Diagnosis not present

## 2014-08-19 DIAGNOSIS — Z90710 Acquired absence of both cervix and uterus: Secondary | ICD-10-CM | POA: Diagnosis not present

## 2014-08-19 DIAGNOSIS — K219 Gastro-esophageal reflux disease without esophagitis: Secondary | ICD-10-CM | POA: Diagnosis not present

## 2014-08-19 DIAGNOSIS — M129 Arthropathy, unspecified: Secondary | ICD-10-CM | POA: Insufficient documentation

## 2014-08-19 DIAGNOSIS — I499 Cardiac arrhythmia, unspecified: Secondary | ICD-10-CM | POA: Diagnosis not present

## 2014-08-19 HISTORY — PX: PARS PLANA VITRECTOMY: SHX2166

## 2014-08-19 LAB — BASIC METABOLIC PANEL
Anion gap: 12 (ref 5–15)
BUN: 31 mg/dL — AB (ref 6–23)
CO2: 23 mEq/L (ref 19–32)
Calcium: 9.2 mg/dL (ref 8.4–10.5)
Chloride: 105 mEq/L (ref 96–112)
Creatinine, Ser: 1.26 mg/dL — ABNORMAL HIGH (ref 0.50–1.10)
GFR calc Af Amer: 48 mL/min — ABNORMAL LOW (ref >90)
GFR calc non Af Amer: 41 mL/min — ABNORMAL LOW (ref >90)
GLUCOSE: 88 mg/dL (ref 70–99)
POTASSIUM: 4.6 meq/L (ref 3.7–5.3)
Sodium: 140 mEq/L (ref 137–147)

## 2014-08-19 LAB — CBC
HCT: 31.1 % — ABNORMAL LOW (ref 36.0–46.0)
HEMOGLOBIN: 10.2 g/dL — AB (ref 12.0–15.0)
MCH: 31.8 pg (ref 26.0–34.0)
MCHC: 32.8 g/dL (ref 30.0–36.0)
MCV: 96.9 fL (ref 78.0–100.0)
Platelets: 166 10*3/uL (ref 150–400)
RBC: 3.21 MIL/uL — ABNORMAL LOW (ref 3.87–5.11)
RDW: 14.2 % (ref 11.5–15.5)
WBC: 3.8 10*3/uL — ABNORMAL LOW (ref 4.0–10.5)

## 2014-08-19 SURGERY — 25 GAUGE PARS PLANA VITRECTOMY WITH 23 GAUGE MVR PORT
Anesthesia: General | Site: Eye | Laterality: Right

## 2014-08-19 MED ORDER — MIDAZOLAM HCL 2 MG/2ML IJ SOLN
INTRAMUSCULAR | Status: AC
  Filled 2014-08-19: qty 2

## 2014-08-19 MED ORDER — GATIFLOXACIN 0.5 % OP SOLN
1.0000 [drp] | Freq: Four times a day (QID) | OPHTHALMIC | Status: DC
  Filled 2014-08-19: qty 2.5

## 2014-08-19 MED ORDER — LIDOCAINE HCL 4 % MT SOLN
OROMUCOSAL | Status: DC | PRN
  Administered 2014-08-19: 4 mL via TOPICAL

## 2014-08-19 MED ORDER — ROCURONIUM BROMIDE 50 MG/5ML IV SOLN
INTRAVENOUS | Status: AC
  Filled 2014-08-19: qty 1

## 2014-08-19 MED ORDER — SODIUM CHLORIDE 0.9 % IV SOLN
INTRAVENOUS | Status: DC | PRN
  Administered 2014-08-19 (×2): via INTRAVENOUS

## 2014-08-19 MED ORDER — MORPHINE SULFATE 2 MG/ML IJ SOLN: 1.0000 mg | INTRAMUSCULAR | Status: DC | PRN

## 2014-08-19 MED ORDER — SODIUM CHLORIDE 0.45 % IV SOLN
INTRAVENOUS | Status: DC
  Administered 2014-08-19: 18:00:00 via INTRAVENOUS

## 2014-08-19 MED ORDER — BSS IO SOLN
INTRAOCULAR | Status: AC
  Filled 2014-08-19: qty 15

## 2014-08-19 MED ORDER — ROCURONIUM BROMIDE 100 MG/10ML IV SOLN
INTRAVENOUS | Status: DC | PRN
  Administered 2014-08-19: 30 mg via INTRAVENOUS

## 2014-08-19 MED ORDER — INFLUENZA VAC SPLIT QUAD 0.5 ML IM SUSY
0.5000 mL | PREFILLED_SYRINGE | INTRAMUSCULAR | Status: AC
  Administered 2014-08-20: 0.5 mL via INTRAMUSCULAR
  Filled 2014-08-19: qty 0.5

## 2014-08-19 MED ORDER — GLYCOPYRROLATE 0.2 MG/ML IJ SOLN
INTRAMUSCULAR | Status: AC
  Filled 2014-08-19: qty 3

## 2014-08-19 MED ORDER — CYCLOPENTOLATE HCL 1 % OP SOLN
1.0000 [drp] | OPHTHALMIC | Status: AC | PRN
  Administered 2014-08-19 (×3): 1 [drp] via OPHTHALMIC

## 2014-08-19 MED ORDER — NEOSTIGMINE METHYLSULFATE 10 MG/10ML IV SOLN
INTRAVENOUS | Status: DC | PRN
  Administered 2014-08-19: 4 mg via INTRAVENOUS

## 2014-08-19 MED ORDER — GLYCOPYRROLATE 0.2 MG/ML IJ SOLN
INTRAMUSCULAR | Status: DC | PRN
  Administered 2014-08-19 (×2): 0.1 mg via INTRAVENOUS
  Administered 2014-08-19: 0.6 mg via INTRAVENOUS

## 2014-08-19 MED ORDER — HYALURONIDASE HUMAN 150 UNIT/ML IJ SOLN
INTRAMUSCULAR | Status: AC
  Filled 2014-08-19: qty 1

## 2014-08-19 MED ORDER — BUPIVACAINE HCL (PF) 0.75 % IJ SOLN
INTRAMUSCULAR | Status: AC
  Filled 2014-08-19: qty 10

## 2014-08-19 MED ORDER — EPINEPHRINE HCL 1 MG/ML IJ SOLN
INTRAMUSCULAR | Status: DC | PRN
  Administered 2014-08-19: 15:00:00

## 2014-08-19 MED ORDER — METOPROLOL SUCCINATE ER 25 MG PO TB24
25.0000 mg | ORAL_TABLET | Freq: Every day | ORAL | Status: DC
  Administered 2014-08-20: 25 mg via ORAL
  Filled 2014-08-19: qty 1

## 2014-08-19 MED ORDER — PREDNISOLONE ACETATE 1 % OP SUSP
1.0000 [drp] | Freq: Four times a day (QID) | OPHTHALMIC | Status: DC
  Filled 2014-08-19: qty 1

## 2014-08-19 MED ORDER — ACETAZOLAMIDE SODIUM 500 MG IJ SOLR
INTRAMUSCULAR | Status: AC
  Filled 2014-08-19: qty 500

## 2014-08-19 MED ORDER — FENTANYL CITRATE 0.05 MG/ML IJ SOLN
25.0000 ug | INTRAMUSCULAR | Status: DC | PRN
  Administered 2014-08-19 (×3): 25 ug via INTRAVENOUS

## 2014-08-19 MED ORDER — GENTAMICIN SULFATE 40 MG/ML IJ SOLN
INTRAMUSCULAR | Status: AC
  Filled 2014-08-19: qty 2

## 2014-08-19 MED ORDER — BUPIVACAINE HCL (PF) 0.75 % IJ SOLN
INTRAMUSCULAR | Status: DC | PRN
  Administered 2014-08-19: 20 mL

## 2014-08-19 MED ORDER — CLONAZEPAM 0.5 MG PO TABS
0.2500 mg | ORAL_TABLET | Freq: Every day | ORAL | Status: DC
  Administered 2014-08-19: 0.25 mg via ORAL
  Filled 2014-08-19: qty 1

## 2014-08-19 MED ORDER — PHENYLEPHRINE HCL 2.5 % OP SOLN
1.0000 [drp] | OPHTHALMIC | Status: AC | PRN
  Administered 2014-08-19 (×3): 1 [drp] via OPHTHALMIC

## 2014-08-19 MED ORDER — ONDANSETRON HCL 4 MG/2ML IJ SOLN
INTRAMUSCULAR | Status: AC
  Filled 2014-08-19: qty 2

## 2014-08-19 MED ORDER — PROMETHAZINE HCL 25 MG/ML IJ SOLN: 6.2500 mg | INTRAMUSCULAR | Status: DC | PRN

## 2014-08-19 MED ORDER — SODIUM CHLORIDE 0.9 % IJ SOLN
INTRAMUSCULAR | Status: AC
  Filled 2014-08-19: qty 10

## 2014-08-19 MED ORDER — ATROPINE SULFATE 1 % OP SOLN
OPHTHALMIC | Status: AC
  Filled 2014-08-19: qty 2

## 2014-08-19 MED ORDER — PHENYLEPHRINE HCL 10 MG/ML IJ SOLN
10.0000 mg | INTRAVENOUS | Status: DC | PRN
  Administered 2014-08-19: 25 ug/min via INTRAVENOUS

## 2014-08-19 MED ORDER — ACETAZOLAMIDE SODIUM 500 MG IJ SOLR
500.0000 mg | Freq: Once | INTRAMUSCULAR | Status: AC
  Administered 2014-08-20: 500 mg via INTRAVENOUS
  Filled 2014-08-19: qty 500

## 2014-08-19 MED ORDER — HYDROCODONE-ACETAMINOPHEN 5-325 MG PO TABS
1.0000 | ORAL_TABLET | ORAL | Status: DC | PRN
  Administered 2014-08-19: 2 via ORAL
  Administered 2014-08-20: 1 via ORAL
  Filled 2014-08-19: qty 2
  Filled 2014-08-19: qty 1

## 2014-08-19 MED ORDER — LATANOPROST 0.005 % OP SOLN
1.0000 [drp] | Freq: Every day | OPHTHALMIC | Status: DC
  Filled 2014-08-19: qty 2.5

## 2014-08-19 MED ORDER — TETRACAINE HCL 0.5 % OP SOLN
2.0000 [drp] | Freq: Once | OPHTHALMIC | Status: DC
  Filled 2014-08-19: qty 2

## 2014-08-19 MED ORDER — SODIUM CHLORIDE 0.9 % IV SOLN: INTRAVENOUS | Status: DC

## 2014-08-19 MED ORDER — BACITRACIN-POLYMYXIN B 500-10000 UNIT/GM OP OINT
TOPICAL_OINTMENT | OPHTHALMIC | Status: AC
  Filled 2014-08-19: qty 3.5

## 2014-08-19 MED ORDER — TROPICAMIDE 1 % OP SOLN
1.0000 [drp] | OPHTHALMIC | Status: AC | PRN
  Administered 2014-08-19 (×3): 1 [drp] via OPHTHALMIC

## 2014-08-19 MED ORDER — TRAMADOL HCL 50 MG PO TABS
50.0000 mg | ORAL_TABLET | Freq: Two times a day (BID) | ORAL | Status: DC
  Administered 2014-08-19: 100 mg via ORAL
  Administered 2014-08-20: 50 mg via ORAL
  Filled 2014-08-19: qty 1
  Filled 2014-08-19: qty 2

## 2014-08-19 MED ORDER — FENTANYL CITRATE 0.05 MG/ML IJ SOLN
INTRAMUSCULAR | Status: DC | PRN
  Administered 2014-08-19: 50 ug via INTRAVENOUS

## 2014-08-19 MED ORDER — DOCUSATE SODIUM 100 MG PO CAPS
100.0000 mg | ORAL_CAPSULE | Freq: Two times a day (BID) | ORAL | Status: DC
  Administered 2014-08-19 – 2014-08-20 (×2): 100 mg via ORAL
  Filled 2014-08-19 (×2): qty 1

## 2014-08-19 MED ORDER — SODIUM HYALURONATE 10 MG/ML IO SOLN
INTRAOCULAR | Status: AC
  Filled 2014-08-19: qty 0.85

## 2014-08-19 MED ORDER — DEXAMETHASONE SODIUM PHOSPHATE 10 MG/ML IJ SOLN
INTRAMUSCULAR | Status: AC
  Filled 2014-08-19: qty 1

## 2014-08-19 MED ORDER — LIDOCAINE HCL (CARDIAC) 20 MG/ML IV SOLN
INTRAVENOUS | Status: AC
  Filled 2014-08-19: qty 5

## 2014-08-19 MED ORDER — FENTANYL CITRATE 0.05 MG/ML IJ SOLN
INTRAMUSCULAR | Status: AC
  Filled 2014-08-19: qty 5

## 2014-08-19 MED ORDER — BSS PLUS IO SOLN
INTRAOCULAR | Status: AC
  Filled 2014-08-19: qty 500

## 2014-08-19 MED ORDER — SODIUM CHLORIDE 0.9 % IJ SOLN
INTRAMUSCULAR | Status: DC | PRN
  Administered 2014-08-19: 15:00:00

## 2014-08-19 MED ORDER — ONDANSETRON HCL 4 MG/2ML IJ SOLN
4.0000 mg | Freq: Four times a day (QID) | INTRAMUSCULAR | Status: DC | PRN
Start: 2014-08-19 — End: 2014-08-20

## 2014-08-19 MED ORDER — POLYMYXIN B SULFATE 500000 UNITS IJ SOLR
INTRAMUSCULAR | Status: AC
  Filled 2014-08-19: qty 1

## 2014-08-19 MED ORDER — TEMAZEPAM 15 MG PO CAPS
15.0000 mg | ORAL_CAPSULE | Freq: Every evening | ORAL | Status: DC | PRN
Start: 2014-08-19 — End: 2014-08-20

## 2014-08-19 MED ORDER — LIDOCAINE HCL (CARDIAC) 20 MG/ML IV SOLN
INTRAVENOUS | Status: DC | PRN
  Administered 2014-08-19: 30 mg via INTRAVENOUS

## 2014-08-19 MED ORDER — 0.9 % SODIUM CHLORIDE (POUR BTL) OPTIME
TOPICAL | Status: DC | PRN
  Administered 2014-08-19: 1000 mL

## 2014-08-19 MED ORDER — MAGNESIUM HYDROXIDE 400 MG/5ML PO SUSP: 15.0000 mL | Freq: Four times a day (QID) | ORAL | Status: DC | PRN

## 2014-08-19 MED ORDER — GABAPENTIN 300 MG PO CAPS
600.0000 mg | ORAL_CAPSULE | Freq: Every day | ORAL | Status: DC
  Administered 2014-08-20: 600 mg via ORAL
  Filled 2014-08-19: qty 2

## 2014-08-19 MED ORDER — PROPOFOL 10 MG/ML IV BOLUS
INTRAVENOUS | Status: DC | PRN
  Administered 2014-08-19: 130 mg via INTRAVENOUS

## 2014-08-19 MED ORDER — LIDOCAINE HCL 2 % IJ SOLN
INTRAMUSCULAR | Status: AC
  Filled 2014-08-19: qty 20

## 2014-08-19 MED ORDER — EPINEPHRINE HCL 1 MG/ML IJ SOLN
INTRAMUSCULAR | Status: AC
  Filled 2014-08-19: qty 1

## 2014-08-19 MED ORDER — SODIUM HYALURONATE 10 MG/ML IO SOLN
INTRAOCULAR | Status: DC | PRN
  Administered 2014-08-19: 0.85 mL via INTRAOCULAR

## 2014-08-19 MED ORDER — HYPROMELLOSE (GONIOSCOPIC) 2.5 % OP SOLN
OPHTHALMIC | Status: AC
  Filled 2014-08-19: qty 15

## 2014-08-19 MED ORDER — BRIMONIDINE TARTRATE 0.2 % OP SOLN
1.0000 [drp] | Freq: Two times a day (BID) | OPHTHALMIC | Status: DC
  Filled 2014-08-19: qty 5

## 2014-08-19 MED ORDER — ONDANSETRON HCL 4 MG/2ML IJ SOLN
INTRAMUSCULAR | Status: DC | PRN
  Administered 2014-08-19: 4 mg via INTRAVENOUS

## 2014-08-19 MED ORDER — BACITRACIN-POLYMYXIN B 500-10000 UNIT/GM OP OINT
1.0000 "application " | TOPICAL_OINTMENT | Freq: Four times a day (QID) | OPHTHALMIC | Status: DC
  Filled 2014-08-19: qty 3.5

## 2014-08-19 MED ORDER — TRIAMCINOLONE ACETONIDE 40 MG/ML IJ SUSP
INTRAMUSCULAR | Status: AC
  Filled 2014-08-19: qty 5

## 2014-08-19 MED ORDER — BACITRACIN-POLYMYXIN B 500-10000 UNIT/GM OP OINT
TOPICAL_OINTMENT | OPHTHALMIC | Status: DC | PRN
  Administered 2014-08-19: 1 via OPHTHALMIC

## 2014-08-19 MED ORDER — GATIFLOXACIN 0.5 % OP SOLN
1.0000 [drp] | OPHTHALMIC | Status: AC | PRN
  Administered 2014-08-19 (×3): 1 [drp] via OPHTHALMIC

## 2014-08-19 MED ORDER — FENTANYL CITRATE 0.05 MG/ML IJ SOLN
INTRAMUSCULAR | Status: AC
  Filled 2014-08-19: qty 2

## 2014-08-19 MED ORDER — ACETAMINOPHEN 325 MG PO TABS: 325.0000 mg | ORAL_TABLET | ORAL | Status: DC | PRN

## 2014-08-19 MED ORDER — EPHEDRINE SULFATE 50 MG/ML IJ SOLN
INTRAMUSCULAR | Status: DC | PRN
  Administered 2014-08-19 (×3): 5 mg via INTRAVENOUS
  Administered 2014-08-19: 10 mg via INTRAVENOUS

## 2014-08-19 SURGICAL SUPPLY — 65 items
BALL CTTN LRG ABS STRL LF (GAUZE/BANDAGES/DRESSINGS) ×3
CANNULA DUAL BORE 23G (CANNULA) ×2 IMPLANT
CANNULA TROCAR 23 GA VLV (OPHTHALMIC) ×3 IMPLANT
CANNULA TROCAR 23G VLV (OPHTHALMIC) ×1 IMPLANT
CANNULA VLV SOFT TIP 25G (OPHTHALMIC) ×1 IMPLANT
CANNULA VLV SOFT TIP 25GA (OPHTHALMIC) ×3 IMPLANT
CORDS BIPOLAR (ELECTRODE) IMPLANT
COTTONBALL LRG STERILE PKG (GAUZE/BANDAGES/DRESSINGS) ×9 IMPLANT
COVER MAYO STAND STRL (DRAPES) ×3 IMPLANT
DRAPE INCISE 51X51 W/FILM STRL (DRAPES) ×3 IMPLANT
DRAPE OPHTHALMIC 77X100 STRL (CUSTOM PROCEDURE TRAY) ×3 IMPLANT
ERASER HMR WETFIELD 23G BP (MISCELLANEOUS) IMPLANT
GAS AUTO FILL CONSTEL (OPHTHALMIC) ×3
GAS AUTO FILL CONSTELLATION (OPHTHALMIC) IMPLANT
GAS OPHTHALMIC (MISCELLANEOUS) ×2 IMPLANT
GLOVE SS BIOGEL STRL SZ 6.5 (GLOVE) ×1 IMPLANT
GLOVE SS BIOGEL STRL SZ 7 (GLOVE) ×1 IMPLANT
GLOVE SUPERSENSE BIOGEL SZ 6.5 (GLOVE) ×2
GLOVE SUPERSENSE BIOGEL SZ 7 (GLOVE) ×2
GLOVE SURG 8.5 LATEX PF (GLOVE) ×3 IMPLANT
GOWN STRL REUS W/ TWL LRG LVL3 (GOWN DISPOSABLE) ×3 IMPLANT
GOWN STRL REUS W/TWL LRG LVL3 (GOWN DISPOSABLE) ×9
HANDLE PNEUMATIC FOR CONSTEL (OPHTHALMIC) IMPLANT
KIT BASIN OR (CUSTOM PROCEDURE TRAY) ×3 IMPLANT
KIT PERFLUORON PROCEDURE 5ML (MISCELLANEOUS) ×2 IMPLANT
KIT ROOM TURNOVER OR (KITS) ×3 IMPLANT
KNIFE CRESCENT 1.75 EDGEAHEAD (BLADE) IMPLANT
KNIFE GRIESHABER SHARP 2.5MM (MISCELLANEOUS) IMPLANT
MICROPICK 25G (MISCELLANEOUS)
NDL 18GX1X1/2 (RX/OR ONLY) (NEEDLE) ×1 IMPLANT
NDL 25GX 5/8IN NON SAFETY (NEEDLE) ×1 IMPLANT
NDL FILTER BLUNT 18X1 1/2 (NEEDLE) IMPLANT
NDL HYPO 30X.5 LL (NEEDLE) ×2 IMPLANT
NEEDLE 18GX1X1/2 (RX/OR ONLY) (NEEDLE) ×3 IMPLANT
NEEDLE 25GX 5/8IN NON SAFETY (NEEDLE) ×3 IMPLANT
NEEDLE FILTER BLUNT 18X 1/2SAF (NEEDLE) ×2
NEEDLE FILTER BLUNT 18X1 1/2 (NEEDLE) ×1 IMPLANT
NEEDLE HYPO 30X.5 LL (NEEDLE) ×6 IMPLANT
NS IRRIG 1000ML POUR BTL (IV SOLUTION) ×3 IMPLANT
PACK VITRECTOMY CUSTOM (CUSTOM PROCEDURE TRAY) ×3 IMPLANT
PAD ARMBOARD 7.5X6 YLW CONV (MISCELLANEOUS) ×6 IMPLANT
PAK PIK VITRECTOMY CVS 25GA (OPHTHALMIC) ×3 IMPLANT
PIC ILLUMINATED 25G (OPHTHALMIC) ×6
PICK MICROPICK 25G (MISCELLANEOUS) IMPLANT
PIK ILLUMINATED 25G (OPHTHALMIC) ×1 IMPLANT
PROBE LASER ILLUM FLEX CVD 25G (OPHTHALMIC) IMPLANT
REPL STRA BRUSH NDL (NEEDLE) ×1 IMPLANT
REPL STRA BRUSH NEEDLE (NEEDLE) ×3 IMPLANT
RESERVOIR BACK FLUSH (MISCELLANEOUS) ×3 IMPLANT
ROLLS DENTAL (MISCELLANEOUS) ×6 IMPLANT
SCRAPER DIAMOND 25GA (OPHTHALMIC RELATED) ×3 IMPLANT
SCRAPER DIAMOND DUST MEMBRANE (MISCELLANEOUS) IMPLANT
SET INJECTOR OIL FLUID CONSTEL (OPHTHALMIC) IMPLANT
SPONGE SURGIFOAM ABS GEL 12-7 (HEMOSTASIS) ×3 IMPLANT
STOPCOCK 4 WAY LG BORE MALE ST (IV SETS) ×3 IMPLANT
SUT SILK 4 0 RB 1 (SUTURE) IMPLANT
SYR 20CC LL (SYRINGE) ×3 IMPLANT
SYR 5ML LL (SYRINGE) IMPLANT
SYR BULB 3OZ (MISCELLANEOUS) ×3 IMPLANT
SYR TB 1ML LUER SLIP (SYRINGE) ×3 IMPLANT
SYRINGE 10CC LL (SYRINGE) ×3 IMPLANT
TOWEL OR 17X26 10 PK STRL BLUE (TOWEL DISPOSABLE) ×3 IMPLANT
TUBING HIGH PRESS EXTEN 6IN (TUBING) ×2 IMPLANT
WATER STERILE IRR 1000ML POUR (IV SOLUTION) ×3 IMPLANT
WIPE INSTRUMENT VISIWIPE 73X73 (MISCELLANEOUS) ×3 IMPLANT

## 2014-08-19 NOTE — Transfer of Care (Signed)
Immediate Anesthesia Transfer of Care Note  Patient: Erika Black  Procedure(s) Performed: Procedure(s): 25 GAUGE PARS PLANA VITRECTOMY WITH 23 GAUGE MVR PORT; ENDOLASER; INJECTION OF C3F8; AIR/FLUID EXCHANGE; MEMBRANE PEEL; PERFLUORON (Right)  Patient Location: PACU  Anesthesia Type:General  Level of Consciousness: awake, alert  and oriented  Airway & Oxygen Therapy: Patient Spontanous Breathing and Patient connected to nasal cannula oxygen  Post-op Assessment: Report given to PACU RN, Post -op Vital signs reviewed and stable and Patient moving all extremities X 4  Post vital signs: Reviewed and stable  Complications: No apparent anesthesia complications

## 2014-08-19 NOTE — Anesthesia Procedure Notes (Signed)
Procedure Name: Intubation Date/Time: 08/19/2014 2:44 PM Performed by: Blair Heys E Pre-anesthesia Checklist: Patient identified, Suction available, Emergency Drugs available and Patient being monitored Patient Re-evaluated:Patient Re-evaluated prior to inductionOxygen Delivery Method: Circle system utilized Preoxygenation: Pre-oxygenation with 100% oxygen Intubation Type: IV induction Ventilation: Mask ventilation without difficulty Laryngoscope Size: Miller and 2 Grade View: Grade I Tube type: Oral Tube size: 7.5 mm Number of attempts: 1 Airway Equipment and Method: Stylet and LTA kit utilized Placement Confirmation: ETT inserted through vocal cords under direct vision,  positive ETCO2 and breath sounds checked- equal and bilateral Secured at: 21 cm Tube secured with: Tape Dental Injury: Teeth and Oropharynx as per pre-operative assessment

## 2014-08-19 NOTE — Brief Op Note (Signed)
Brief Operative note   Preoperative diagnosis:  Recurrent retinal detachment right eye Postoperative diagnosis  Post-Op Diagnosis Codes:    * Retinal detachment with retinal defect, unspecified [361.00]  Procedures: Pars plana vitrectomy, membrane peel, sub retinal fluid drainage, laser, C3F8 gas injection right eye  Surgeon:  Sherrie George, MD...  Assistant:  Rosalie Doctor SA  Charyl Dancer RN  Anesthesia: General  Specimen: none  Estimated blood loss:  1cc  Complications: none  Patient sent to PACU in good condition  Composed by Sherrie George MD  Dictation number: [phone]

## 2014-08-19 NOTE — Anesthesia Postprocedure Evaluation (Signed)
  Anesthesia Post-op Note  Patient: Erika Black  Procedure(s) Performed: Procedure(s): 25 GAUGE PARS PLANA VITRECTOMY WITH 23 GAUGE MVR PORT; ENDOLASER; INJECTION OF C3F8; AIR/FLUID EXCHANGE; MEMBRANE PEEL; PERFLUORON (Right)  Patient Location: PACU  Anesthesia Type:General  Level of Consciousness: awake  Airway and Oxygen Therapy: Patient Spontanous Breathing and Patient connected to nasal cannula oxygen  Post-op Pain: mild  Post-op Assessment: Post-op Vital signs reviewed, Patient's Cardiovascular Status Stable and Respiratory Function Stable  Post-op Vital Signs: Reviewed and stable  Last Vitals:  Filed Vitals:   08/19/14 1631  BP: 100/37  Pulse: 57  Temp:   Resp: 25    Complications: No apparent anesthesia complications

## 2014-08-19 NOTE — H&P (Signed)
I examined the patient today and there is no change in the medical status 

## 2014-08-19 NOTE — Anesthesia Preprocedure Evaluation (Addendum)
Anesthesia Evaluation  Patient identified by MRN, date of birth, ID band Patient awake    Reviewed: Allergy & Precautions, H&P , NPO status , Patient's Chart, lab work & pertinent test results, reviewed documented beta blocker date and time   Airway Mallampati: II TM Distance: >3 FB Neck ROM: Full    Dental  (+) Dental Advisory Given, Edentulous Upper, Edentulous Lower   Pulmonary former smoker,  breath sounds clear to auscultation        Cardiovascular hypertension, Pt. on home beta blockers + dysrhythmias Rhythm:Regular Rate:Normal     Neuro/Psych Anxiety negative neurological ROS     GI/Hepatic Neg liver ROS, GERD-  ,  Endo/Other  negative endocrine ROS  Renal/GU negative Renal ROS  negative genitourinary   Musculoskeletal  (+) Arthritis -,   Abdominal   Peds  Hematology negative hematology ROS (+)   Anesthesia Other Findings   Reproductive/Obstetrics negative OB ROS                        Anesthesia Physical Anesthesia Plan  ASA: II  Anesthesia Plan: General   Post-op Pain Management:    Induction: Intravenous  Airway Management Planned: Oral ETT  Additional Equipment:   Intra-op Plan:   Post-operative Plan: Extubation in OR  Informed Consent: I have reviewed the patients History and Physical, chart, labs and discussed the procedure including the risks, benefits and alternatives for the proposed anesthesia with the patient or authorized representative who has indicated his/her understanding and acceptance.   Dental advisory given  Plan Discussed with: CRNA, Anesthesiologist and Surgeon  Anesthesia Plan Comments:        Anesthesia Quick Evaluation

## 2014-08-19 NOTE — OR Nursing (Signed)
1558- Green, gas bubble,  alert bracelet to LEFT wrist post op.

## 2014-08-20 DIAGNOSIS — H33009 Unspecified retinal detachment with retinal break, unspecified eye: Secondary | ICD-10-CM | POA: Diagnosis not present

## 2014-08-20 MED ORDER — GATIFLOXACIN 0.5 % OP SOLN: 1.0000 [drp] | Freq: Four times a day (QID) | OPHTHALMIC | Status: AC

## 2014-08-20 MED ORDER — PREDNISOLONE ACETATE 1 % OP SUSP: 1.0000 [drp] | Freq: Four times a day (QID) | OPHTHALMIC | Status: AC

## 2014-08-20 MED ORDER — BRIMONIDINE TARTRATE 0.2 % OP SOLN: 1.0000 [drp] | Freq: Two times a day (BID) | OPHTHALMIC | Status: AC

## 2014-08-20 MED ORDER — BACITRACIN-POLYMYXIN B 500-10000 UNIT/GM OP OINT: 1.0000 "application " | TOPICAL_OINTMENT | Freq: Four times a day (QID) | OPHTHALMIC | Status: AC

## 2014-08-20 MED ORDER — HYDROCODONE-ACETAMINOPHEN 5-325 MG PO TABS: 1.0000 | ORAL_TABLET | ORAL | Status: AC | PRN

## 2014-08-20 NOTE — Discharge Summary (Signed)
Discharge summary not needed on [facility] patients per medical records. 

## 2014-08-20 NOTE — Progress Notes (Signed)
Discussed discharge summary with patient and patient son. Reviewed all medications with patient. Patient received Rx. Provided education on influenza vaccination. Patient ready for discharge.

## 2014-08-20 NOTE — Progress Notes (Signed)
08/20/2014, 7:02 AM  Mental Status:  Awake, Alert, Oriented  Anterior segment: Cornea  Clear    Anterior Chamber Clear    Lens:   Cataract  Intra Ocular Pressure21 mmHg with Tonopen  Vitreous: Clear 95%gas bubble   Retina:  Attached Good laser reaction   Impression: Excellent result Retina attached   Final Diagnosis: Active Problems:   Rhegmatogenous retinal detachment of right eye   Plan: start post operative eye drops.  Discharge to home.  Give post operative instructions  Sherrie George 08/20/2014, 7:02 AM

## 2014-08-20 NOTE — Op Note (Signed)
Erika Black, Erika Black               ACCOUNT NO.:  [id number]  MEDICAL RECORD NO.:  [id number]  LOCATION:  6N10C                        FACILITY:  [facility]  PHYSICIAN:  [name]. Ashley Royalty, M.D. DATE OF BIRTH:  1941/04/15  DATE OF PROCEDURE: DATE OF DISCHARGE:                              OPERATIVE REPORT   ADMISSION DIAGNOSIS:  Recurrent rhegmatogenous retinal detachment, right eye.  PROCEDURES:  Pars plana vitrectomy, Perfluoron injection, subretinal fluid drainage, Perfluoron removal, endolaser gas placement, and C3F8, 12% gas placement all in the right eye, also membrane peel in the right eye.  SURGEON:  [name]. Ashley Royalty, M.D.  ASSISTANT:  Rosalie Doctor, SA.  ANESTHESIA:  General.  DETAILS:  Usual prep and drape, a 25-gauge trocar was placed at 8 and 10 o'clock, 23-gauge trocar placed at 2 o'clock.  Provisc placed on the corneal surface.  Pars plana vitrectomy was begun just behind the crystalline lens.  Vitreous membranes were encountered, these were carefully removed under low suction and rapid cutting.  A core vitrectomy was completed and then the attention was carried to the mid periphery where additional vitreous membranes were encountered,  these were carefully removed under low suction and rapid cutting.  It became apparent that there were membranes on the surface of the buckle at 7 o'clock.  The lighted pick was used to engage these membranes and stripped them from the surface of the retina.  The subretinal fluid in the detachment pocket was semi-solid and doughy.  The retinal break was opened and subretinal fluid cannula was placed beneath the retina to remove the thick subretinal fluid.  Perfluoron was injected at the same time to reattach the retina.  A full reattachment was obtained.  The endolaser was placed in the eye, 1640 burns were placed on the area of previous detachment and around the retinal break.  The peripheral arm was removed and room air was then  injected for a total air PFO exchange. A washout procedure was performed to ensure that all PFO was removed. C3F8 in a 12% concentration was then prepared.  Additional fluid was removed from the vitreous cavity, so there was a total 100% gas fluid exchange.  C3F8 12% was then exchanged for intravitreal gas.  The additional laser was performed for a total of 1640 burns.  The laser was able to be done in the area of previous detachment because the retina was fully reattached at that point.  The instruments were removed from the eye.  The 25-gauge and 23-gauge trocars were removed.  The wounds were tested and found to be secure.  Polymyxin and gentamicin were irrigated into tenon space.  Atropine solution was applied.  Decadron 10 mg was injected into the lower subconjunctival space.  Marcaine was injected around the globe for postop pain.  Closing pressure was 10 with a Barraquer tonometer.  COMPLICATIONS:  None.  DURATION:  One hour 30 minutes.  Polysporin ophthalmic ointment a patch and shield were placed.  The patient was awakened and taken to recovery in satisfactory condition.     [name]. Ashley Royalty, M.D.     JDM/MEDQ  D:  08/19/2014  T:  08/20/2014  Job:  [id number]

## 2014-08-25 ENCOUNTER — Encounter (INDEPENDENT_AMBULATORY_CARE_PROVIDER_SITE_OTHER): Payer: Medicare Other | Admitting: Ophthalmology

## 2014-08-25 DIAGNOSIS — H33009 Unspecified retinal detachment with retinal break, unspecified eye: Secondary | ICD-10-CM

## 2014-09-12 ENCOUNTER — Encounter: Payer: Self-pay | Admitting: Internal Medicine

## 2014-09-17 ENCOUNTER — Encounter (INDEPENDENT_AMBULATORY_CARE_PROVIDER_SITE_OTHER): Payer: Medicare Other | Admitting: Ophthalmology

## 2014-09-19 ENCOUNTER — Encounter (INDEPENDENT_AMBULATORY_CARE_PROVIDER_SITE_OTHER): Payer: Medicare Other | Admitting: Ophthalmology

## 2014-09-19 DIAGNOSIS — H338 Other retinal detachments: Secondary | ICD-10-CM

## 2014-12-01 ENCOUNTER — Encounter (INDEPENDENT_AMBULATORY_CARE_PROVIDER_SITE_OTHER): Payer: Medicare Other | Admitting: Ophthalmology

## 2014-12-01 DIAGNOSIS — H2513 Age-related nuclear cataract, bilateral: Secondary | ICD-10-CM

## 2014-12-01 DIAGNOSIS — H43813 Vitreous degeneration, bilateral: Secondary | ICD-10-CM

## 2014-12-01 DIAGNOSIS — H35033 Hypertensive retinopathy, bilateral: Secondary | ICD-10-CM

## 2014-12-01 DIAGNOSIS — H338 Other retinal detachments: Secondary | ICD-10-CM

## 2014-12-01 DIAGNOSIS — I10 Essential (primary) hypertension: Secondary | ICD-10-CM

## 2014-12-01 DIAGNOSIS — H3531 Nonexudative age-related macular degeneration: Secondary | ICD-10-CM

## 2014-12-08 ENCOUNTER — Ambulatory Visit (INDEPENDENT_AMBULATORY_CARE_PROVIDER_SITE_OTHER): Payer: Medicare Other | Admitting: Ophthalmology

## 2014-12-18 ENCOUNTER — Ambulatory Visit (INDEPENDENT_AMBULATORY_CARE_PROVIDER_SITE_OTHER): Payer: Medicare HMO | Admitting: Ophthalmology

## 2014-12-18 DIAGNOSIS — H338 Other retinal detachments: Secondary | ICD-10-CM

## 2015-03-19 ENCOUNTER — Encounter (INDEPENDENT_AMBULATORY_CARE_PROVIDER_SITE_OTHER): Payer: Medicare HMO | Admitting: Ophthalmology

## 2015-03-19 DIAGNOSIS — H338 Other retinal detachments: Secondary | ICD-10-CM

## 2015-03-19 DIAGNOSIS — H2513 Age-related nuclear cataract, bilateral: Secondary | ICD-10-CM | POA: Diagnosis not present

## 2015-03-19 DIAGNOSIS — H3531 Nonexudative age-related macular degeneration: Secondary | ICD-10-CM

## 2015-03-19 DIAGNOSIS — I10 Essential (primary) hypertension: Secondary | ICD-10-CM | POA: Diagnosis not present

## 2015-03-19 DIAGNOSIS — H43812 Vitreous degeneration, left eye: Secondary | ICD-10-CM

## 2015-03-19 DIAGNOSIS — H35033 Hypertensive retinopathy, bilateral: Secondary | ICD-10-CM

## 2015-04-21 ENCOUNTER — Encounter: Payer: Self-pay | Admitting: Internal Medicine

## 2015-05-13 IMAGING — CR DG CHEST 2V
2 series · 2 of 2 positions shown · non-contrast
Comparison: Prior chest x-ray 11/27/2013

CLINICAL DATA: Preoperative chest x-ray prior to retina repair

EXAM:
CHEST  2 VIEW

[w chest pa]
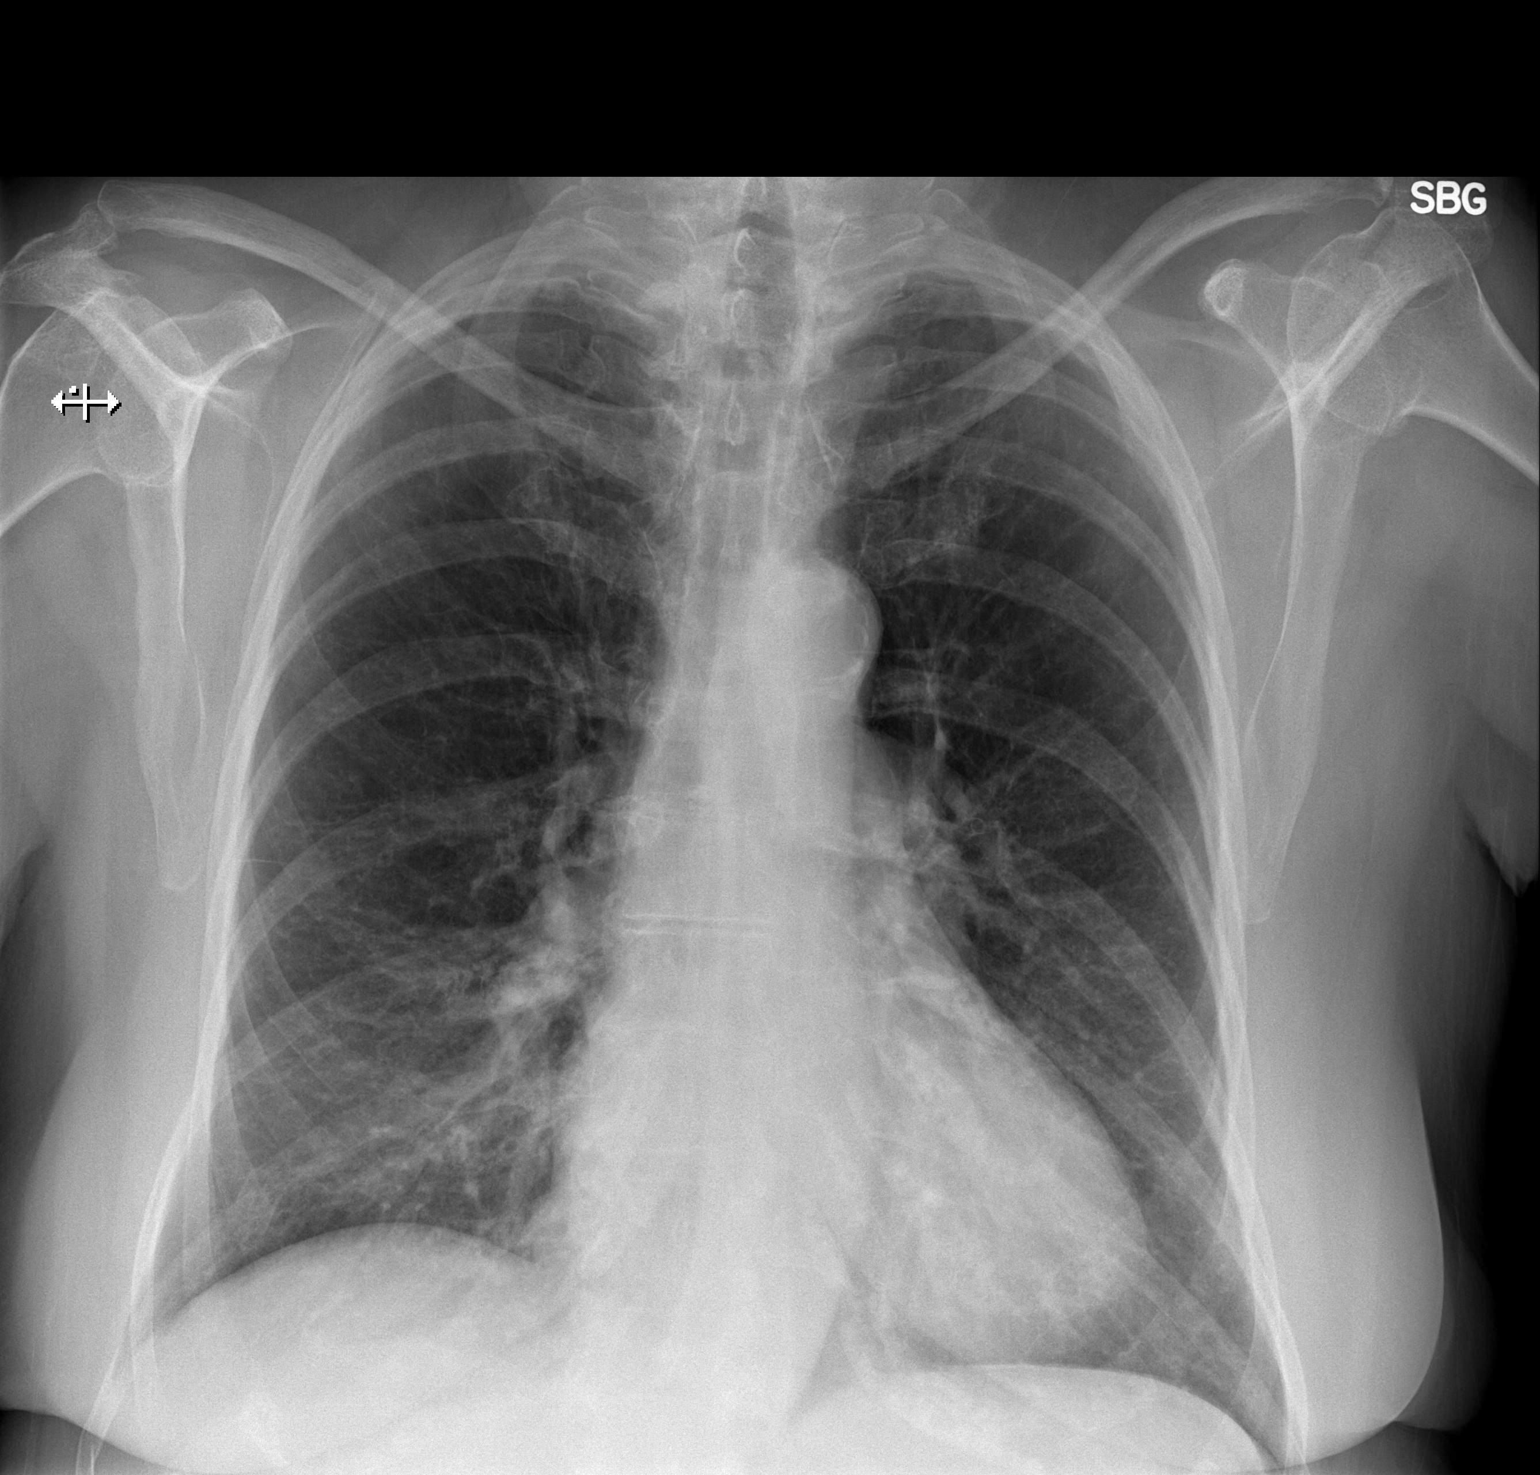

[w chest lat]
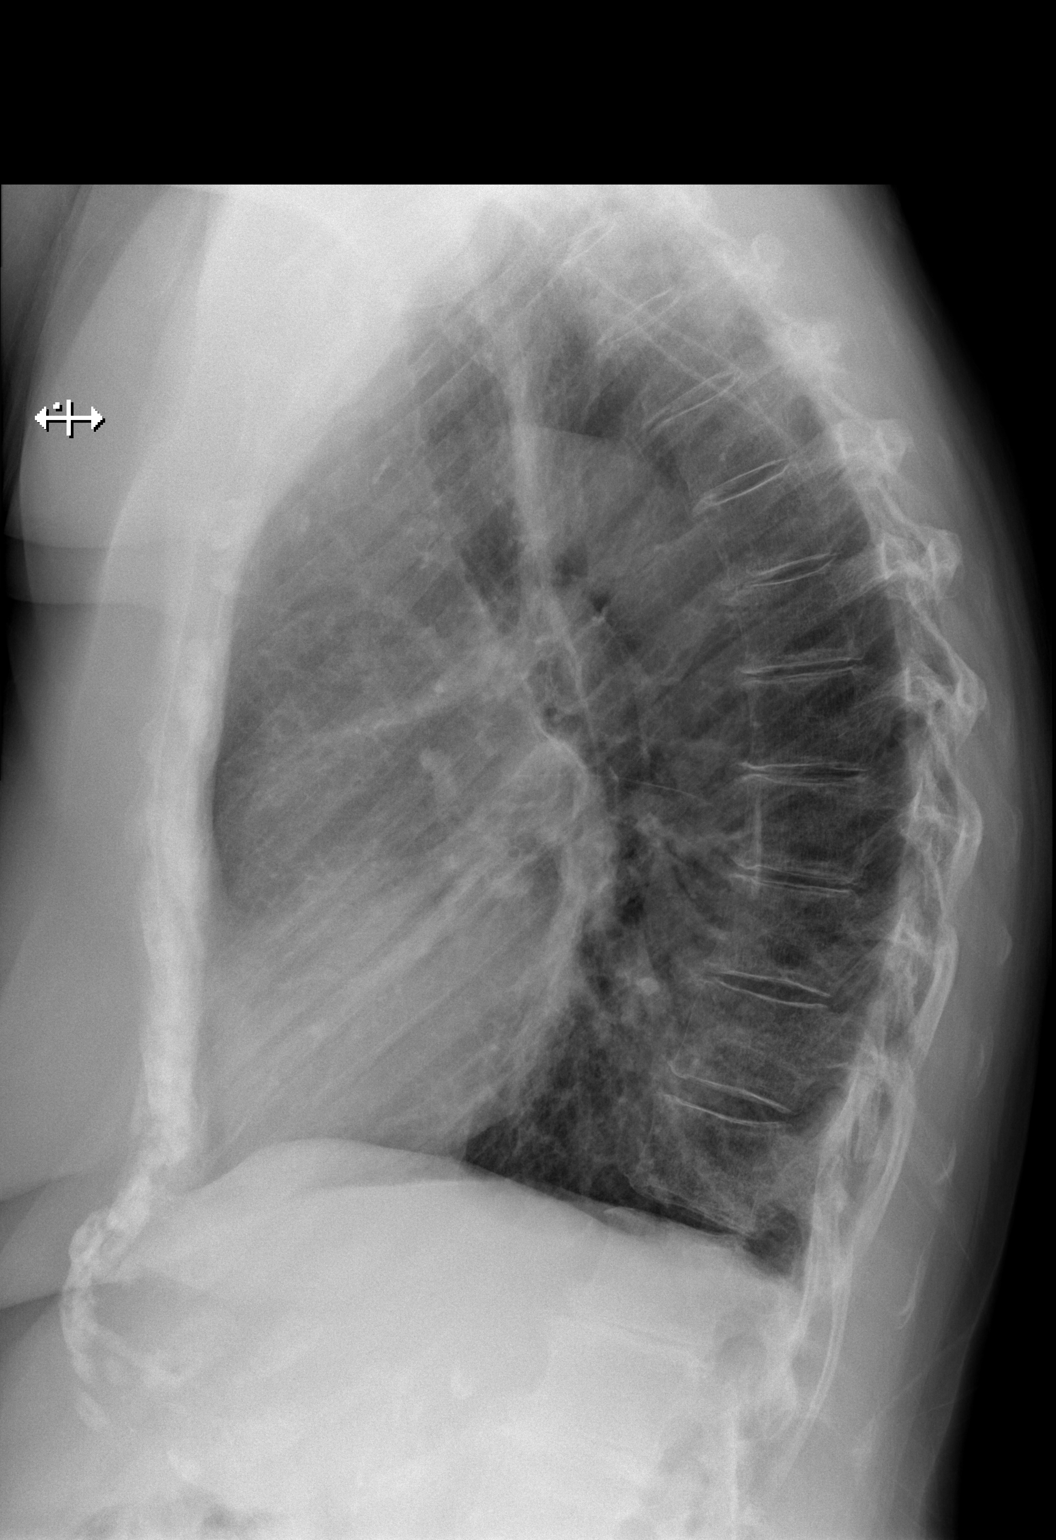

[2 of 2 positions shown; findings below may reference images not displayed]

FINDINGS: Borderline cardiomegaly. Atherosclerotic calcifications are present
in the transverse aorta. No focal airspace consolidation, pleural
effusion or pneumothorax. The lungs are hyperexpanded and there are
central bronchitic changes suggesting underlying COPD. No acute
osseous abnormality. Is
IMPRESSION: No active cardiopulmonary disease.

Stable changes of COPD and borderline cardiomegaly.

## 2015-09-18 ENCOUNTER — Ambulatory Visit (INDEPENDENT_AMBULATORY_CARE_PROVIDER_SITE_OTHER): Payer: Medicare HMO | Admitting: Ophthalmology

## 2015-10-07 ENCOUNTER — Ambulatory Visit (INDEPENDENT_AMBULATORY_CARE_PROVIDER_SITE_OTHER): Payer: Medicare HMO | Admitting: Ophthalmology

## 2015-10-07 DIAGNOSIS — H59031 Cystoid macular edema following cataract surgery, right eye: Secondary | ICD-10-CM

## 2015-10-07 DIAGNOSIS — H353122 Nonexudative age-related macular degeneration, left eye, intermediate dry stage: Secondary | ICD-10-CM | POA: Diagnosis not present

## 2015-10-07 DIAGNOSIS — H43813 Vitreous degeneration, bilateral: Secondary | ICD-10-CM | POA: Diagnosis not present

## 2015-10-07 DIAGNOSIS — H35033 Hypertensive retinopathy, bilateral: Secondary | ICD-10-CM

## 2015-10-07 DIAGNOSIS — H338 Other retinal detachments: Secondary | ICD-10-CM

## 2015-10-07 DIAGNOSIS — H353114 Nonexudative age-related macular degeneration, right eye, advanced atrophic with subfoveal involvement: Secondary | ICD-10-CM

## 2015-10-07 DIAGNOSIS — I10 Essential (primary) hypertension: Secondary | ICD-10-CM | POA: Diagnosis not present

## 2015-11-18 ENCOUNTER — Encounter (INDEPENDENT_AMBULATORY_CARE_PROVIDER_SITE_OTHER): Payer: Medicare HMO | Admitting: Ophthalmology

## 2015-11-18 DIAGNOSIS — H59031 Cystoid macular edema following cataract surgery, right eye: Secondary | ICD-10-CM

## 2015-11-18 DIAGNOSIS — H35033 Hypertensive retinopathy, bilateral: Secondary | ICD-10-CM

## 2015-11-18 DIAGNOSIS — I10 Essential (primary) hypertension: Secondary | ICD-10-CM | POA: Diagnosis not present

## 2015-11-18 DIAGNOSIS — H43812 Vitreous degeneration, left eye: Secondary | ICD-10-CM | POA: Diagnosis not present

## 2015-11-18 DIAGNOSIS — H353122 Nonexudative age-related macular degeneration, left eye, intermediate dry stage: Secondary | ICD-10-CM | POA: Diagnosis not present

## 2015-11-18 DIAGNOSIS — H353114 Nonexudative age-related macular degeneration, right eye, advanced atrophic with subfoveal involvement: Secondary | ICD-10-CM

## 2015-12-30 ENCOUNTER — Encounter (INDEPENDENT_AMBULATORY_CARE_PROVIDER_SITE_OTHER): Payer: Medicare Other | Admitting: Ophthalmology

## 2015-12-30 DIAGNOSIS — I10 Essential (primary) hypertension: Secondary | ICD-10-CM

## 2015-12-30 DIAGNOSIS — H43813 Vitreous degeneration, bilateral: Secondary | ICD-10-CM

## 2015-12-30 DIAGNOSIS — H35033 Hypertensive retinopathy, bilateral: Secondary | ICD-10-CM | POA: Diagnosis not present

## 2015-12-30 DIAGNOSIS — H338 Other retinal detachments: Secondary | ICD-10-CM

## 2015-12-30 DIAGNOSIS — H59031 Cystoid macular edema following cataract surgery, right eye: Secondary | ICD-10-CM | POA: Diagnosis not present

## 2015-12-30 DIAGNOSIS — H353114 Nonexudative age-related macular degeneration, right eye, advanced atrophic with subfoveal involvement: Secondary | ICD-10-CM

## 2015-12-30 DIAGNOSIS — H353122 Nonexudative age-related macular degeneration, left eye, intermediate dry stage: Secondary | ICD-10-CM

## 2016-04-28 ENCOUNTER — Ambulatory Visit (INDEPENDENT_AMBULATORY_CARE_PROVIDER_SITE_OTHER): Payer: Medicare Other | Admitting: Ophthalmology

## 2016-07-12 DEATH — deceased
